# Patient Record
Sex: Female | Born: 1985 | Race: Black or African American | Hispanic: No | Marital: Married | Smoking: Never smoker
Health system: Southern US, Community
[De-identification: ages and names within clinical notes are randomized; demographics above are authoritative.]

## PROBLEM LIST (undated history)

## (undated) DIAGNOSIS — E079 Disorder of thyroid, unspecified: Secondary | ICD-10-CM

## (undated) DIAGNOSIS — F419 Anxiety disorder, unspecified: Secondary | ICD-10-CM

## (undated) DIAGNOSIS — I10 Essential (primary) hypertension: Secondary | ICD-10-CM

## (undated) HISTORY — DX: Anxiety disorder, unspecified: F41.9

## (undated) HISTORY — PX: THYROID CYST EXCISION: SHX2511

---

## 2013-06-12 DIAGNOSIS — F411 Generalized anxiety disorder: Secondary | ICD-10-CM | POA: Insufficient documentation

## 2013-06-23 ENCOUNTER — Emergency Department (HOSPITAL_COMMUNITY): Payer: PRIVATE HEALTH INSURANCE

## 2013-06-23 ENCOUNTER — Encounter (HOSPITAL_COMMUNITY): Payer: Self-pay | Admitting: Emergency Medicine

## 2013-06-23 DIAGNOSIS — Z79899 Other long term (current) drug therapy: Secondary | ICD-10-CM | POA: Insufficient documentation

## 2013-06-23 DIAGNOSIS — Z880 Allergy status to penicillin: Secondary | ICD-10-CM | POA: Insufficient documentation

## 2013-06-23 DIAGNOSIS — F411 Generalized anxiety disorder: Secondary | ICD-10-CM | POA: Insufficient documentation

## 2013-06-23 DIAGNOSIS — R079 Chest pain, unspecified: Secondary | ICD-10-CM | POA: Insufficient documentation

## 2013-06-23 LAB — CBC
HCT: 40.7 % (ref 36.0–46.0)
Hemoglobin: 13.8 g/dL (ref 12.0–15.0)
MCH: 26.4 pg (ref 26.0–34.0)
MCHC: 33.9 g/dL (ref 30.0–36.0)
MCV: 77.8 fL — ABNORMAL LOW (ref 78.0–100.0)
Platelets: 290 K/uL (ref 150–400)
RBC: 5.23 MIL/uL — ABNORMAL HIGH (ref 3.87–5.11)
RDW: 13.9 % (ref 11.5–15.5)
WBC: 7.4 K/uL (ref 4.0–10.5)

## 2013-06-23 LAB — BASIC METABOLIC PANEL
BUN: 12 mg/dL (ref 6–23)
CO2: 24 mEq/L (ref 19–32)
Calcium: 10.9 mg/dL — ABNORMAL HIGH (ref 8.4–10.5)
Chloride: 98 mEq/L (ref 96–112)
Creatinine, Ser: 0.66 mg/dL (ref 0.50–1.10)
GFR calc Af Amer: >90 mL/min (ref >90)
GFR calc non Af Amer: >90 mL/min (ref >90)
GLUCOSE: 91 mg/dL (ref 70–99)
POTASSIUM: 3.7 meq/L (ref 3.7–5.3)
SODIUM: 139 meq/L (ref 137–147)

## 2013-06-23 LAB — I-STAT TROPONIN, ED: Troponin i, poc: 0 ng/mL (ref 0.00–0.08)

## 2013-06-23 NOTE — ED Notes (Addendum)
Pt reports 5/10 central CP with radiation to right arm x 1 week, intermittent. Denies SOB, diaphoresis, N/V.Pt reports increased stress, tearful in triage. [facility], states "they told me it was stress." NAD.

## 2013-06-24 ENCOUNTER — Emergency Department (HOSPITAL_COMMUNITY)
Admission: EM | Admit: 2013-06-24 | Discharge: 2013-06-24 | Disposition: A | Discharge status: Home / Self Care | Payer: PRIVATE HEALTH INSURANCE | Attending: Emergency Medicine | Admitting: Emergency Medicine

## 2013-06-24 DIAGNOSIS — R079 Chest pain, unspecified: Secondary | ICD-10-CM

## 2013-06-24 DIAGNOSIS — F419 Anxiety disorder, unspecified: Secondary | ICD-10-CM

## 2013-06-24 MED ORDER — ALPRAZOLAM 0.5 MG PO TABS: 0.5000 mg | ORAL_TABLET | Freq: Two times a day (BID) | ORAL | Status: AC | PRN

## 2013-06-24 MED ORDER — ALPRAZOLAM 0.5 MG PO TABS: 0.5000 mg | ORAL_TABLET | Freq: Every evening | ORAL | Status: DC | PRN

## 2013-06-24 NOTE — Discharge Instructions (Signed)
°Emergency Department Resource Guide °1) Find a Doctor and Pay Out of Pocket °Although you won't have to find out who is covered by your insurance plan, it is a good idea to ask around and get recommendations. You will then need to call the office and see if the doctor you have chosen will accept you as a new patient and what types of options they offer for patients who are self-pay. Some doctors offer discounts or will set up payment plans for their patients who do not have insurance, but you will need to ask so you aren't surprised when you get to your appointment. ° °2) Contact Your Local Health Department °Not all health departments have doctors that can see patients for sick visits, but many do, so it is worth a call to see if yours does. If you don't know where your local health department is, you can check in your phone book. The CDC also has a tool to help you locate your state's health department, and many state websites also have listings of all of their local health departments. ° °3) Find a Walk-in Clinic °If your illness is not likely to be very severe or complicated, you may want to try a walk in clinic. These are popping up all over the country in pharmacies, drugstores, and shopping centers. They're usually staffed by nurse practitioners or physician assistants that have been trained to treat common illnesses and complaints. They're usually fairly quick and inexpensive. However, if you have serious medical issues or chronic medical problems, these are probably not your best option. ° °No Primary Care Doctor: °- Call [facility] at  832-8000 - they can help you locate a primary care doctor that  accepts your insurance, provides certain services, etc. °- Physician Referral Service- 1-800-533-3463 ° °Chronic Pain Problems: °Organization         Address  Phone   Notes  °[facility]  (336) 297-2271 Patients need to be referred by their primary care doctor.  ° °Medication  Assistance: °Organization         Address  Phone   Notes  °[place] [facility] [address] °[place], [place] [address] (336) 641-8030 --Must be a resident of [place] °-- Must have NO insurance coverage whatsoever (no Medicaid/ Medicare, etc.) °-- The pt. MUST have a primary care doctor that directs their care regularly and follows them in the community °  °[facility]  (866) 331-1348   °[employer]  (888) 892-1162   ° °Agencies that provide inexpensive medical care: °Organization         Address  Phone   Notes  °[facility]  (336) 832-8035   °[facility]    (336) 832-7272   °[facility] [address] °[place], [place] [address] (336) 832-4777   °[facility] [address], °[place] (336) 271-4999   °[facility]    (336) 373-0678   °[facility]    (336) 272-1050   °[facility] ° [address], [place] Phone:  (336) 832-4444, Fax:  (336) 832-4440 Hours of Operation:  9 am - 6 pm, M-F.  Also accepts Medicaid/Medicare and self-pay.  °[facility] ° [address],  Phone: (336) 832-3150, Fax: (336) 832-3151. Hours of Operation:  8:30 am - 5:30 pm, M-F.  Also accepts Medicaid and self-pay.  °[facility] [place] [address], [place] Phone: (336) 878-6027   °[facility] [address], [place], [place] (336)723-1848, Ext. 123 Mondays & Thursdays: 7-9 AM.  First 15 patients are seen on a first come, first serve basis. °  ° °Medicaid-accepting [place] Providers: ° °Organization         Address  Phone   Notes  °[facility] [address], [place] (336) 641-2100 Also accepts self-pay patients.  °[facility] [address], [place] ° (336) 856-9996   °[facility] [address], [place]  (336) 288-8857   °[facility] [address] [place] [address], [place] (336) 299-7000   °Veita Bland [address], [place]  ° (336) 373-1557 Only accepts [place] Access Medicaid patients after they have their name applied to their card.  ° °Self-Pay (no insurance) in [place]: ° °Organization         Address  Phone   Notes  °Sickle Cell Patients, [facility] [address], [place] (336) 832-1970   °[facility] [address], [place] (336) 832-4400   ° [facility] ° [address], [place] (336) 992-4800   °[facility]/Dr. Osei-Bonsu ° [address] [place] [address], [place] or [address], [place] (336) 841-8500 Phone number for both [place] and [place] locations is the same.  °[facility] [address], [place] (336) 299-0000   °[facility] [address] [place] [address], [place] or [address] (336) 852-7530 °(336) 878-2260   °[facility] [address], [place] (336) 350-1642, phone; (336) 294-5005, fax Sees patients 1st and 3rd Saturday of every month.  Must not qualify for public or private insurance (i.e. Medicaid, Medicare, [place] Health Choice, Veterans' Benefits) • Household income should be no more than 200% of the poverty level •The clinic cannot treat you if you are pregnant or think you are pregnant • Sexually transmitted diseases are not treated at the clinic.  ° ° °Dental Care: °Organization         Address  Phone  Notes  °[place] [facility] [address], [place] (336) 641-6152 Accepts children up to age 21 who are enrolled in Medicaid or [place] Health Choice; pregnant women with a Medicaid card; and children who have applied for Medicaid or [place] Health Choice, but were declined, whose parents can pay a reduced fee at time of service.  °[place]  [facility] [place]  [address], [place] (336) 641-7733 Accepts children up to age 21 who are enrolled in Medicaid or [place] Health Choice; pregnant women with a Medicaid card; and children who have applied for Medicaid or [place] Health Choice, but were declined, whose parents can pay a reduced fee at time of service.  °[facility] ° [address],  (336) 641-4533 Patients are seen by appointment only. Walk-ins are not accepted. [facility] will see patients 18 years of age and older. °Monday - Tuesday (8am-5pm) °Most Wednesdays (8:30-5pm) °$30 per visit, cash only  °[facility] ° [address], [place] (336) 641-4533 Patients are seen by appointment only. Walk-ins are not accepted. [facility] will see patients 18 years of age and older. °One   Wednesday Evening (Monthly: Volunteer Based).  $30 per visit, cash only  °[facility]  (919) 537-3737 for adults; Children under age 4, call Graduate Pediatric Dentistry at (919) [phone]. Children aged 4-14, please call (919) [phone] to request a pediatric application. ° Dental services are provided in all areas of dental care including fillings, crowns and bridges, complete and partial dentures, implants, gum treatment, root canals, and extractions. Preventive care is also provided. Treatment is provided to both adults and children. °Patients are selected via a lottery and there is often a waiting list. °  °[facility] [address], °[place] ° (336) 763-8833 [email or web address].[email or web address] °  °[facility] [address], [place], [place] (336)723-1848, Ext. 123 Second and Fourth Thursday of each month, opens at 6:30 AM; Clinic ends at 9 AM.  Patients are seen on a first-come first-served basis, and a limited number are seen during each clinic.  ° °[facility] ° [address], [place], [place] (336) 723-7904    Eligibility Requirements °You must have lived in [place], or [place] counties for at least the last three months. °  You cannot be eligible for state or federal sponsored healthcare insurance, including Veterans Administration, Medicaid, or Medicare. °  You generally cannot be eligible for healthcare insurance through your employer.  °  How to apply: °Eligibility screenings are held every Tuesday and Wednesday afternoon from 1:00 pm until 4:00 pm. You do not need an appointment for the interview!  °[facility] [address], [place], [place] 336-631-2330   °[facility]  336-342-8273   °[facility]  336-703-3100   °[facility]  336-570-6415   ° °Behavioral Health Resources in the Community: °Intensive Outpatient Programs °Organization         Address  Phone  Notes  °[facility] [address], [place], [place] 336-878-6098   °[facility] [address], [place], [place] 336-832-9800   °[facility] [address], [place], [place] ° 336-882-2125   °[facility] [address],  °[place], [place] 1-800-853-5163 or 336-641-4981   °Substance Abuse Resources °Organization         Address  Phone  Notes  °[facility]  336-882-2125   °[facility]  336-784-9470   °[facility]  336-285-9073   °[facility]  336-845-3988   °Residential & Outpatient Substance Abuse Program  1-800-659-3381   °Psychological Services °Organization         Address  Phone  Notes  °[facility]  336- 832-9600   °[facility]  336- 378-7881   °[facility] [address], [place] 1-800-853-5163 or 336-641-4981   ° °Mobile Crisis Teams °Organization         Address  Phone  Notes  °[facility], Mobile Crisis Care Unit  1-877-626-1772   °Assertive °[facility] ° [address]  [place], [place] 336-834-9664   °Sharon DeEsch [address] °[place] 336-554-5454   ° °Self-Help/Support Groups °Organization         Address  Phone             Notes  °[facility] - variety of support groups  336- 373-1402 Call for more information  °Narcotics Anonymous (NA), [facility] [address], °[place]  2 meetings at this location  ° °  Residential Treatment Programs °Organization         Address  Phone  Notes  °[facility] [address],    °[place]  1-866-801-8205   °[facility] ° [address], [place], [place] 704-293-8524   °[facility] [address], [place] 336-845-3988 Admissions: 8am-3pm M-F  °[facility] [address]    °[place], [place] 336-841-1104   °[facility] [address], [place], [place] 336-379-7146   °[facility] [address]  °[place], [place] 336-285-9073   °[facility] - Intensive Outpatient [address], [place], [place] 336-852-3033   °[facility] [address]  °[place], [place] 1-877-615-2722 or 336-784-9470   °[facility] [address] [place], [place] 336-227-7417 Accepts Medicaid  °[facility] [address]  ° [place] 1-800-659-3381 Substance Abuse/Addiction Treatment  ° °[place] Behavioral Health Resources °Organization         Address  Phone  Notes  °[facility]  (888) 581-9988   °Julie Brannon, PhD [address] [place], [place]   (336) 349-5553 or (336) 951-0000   °[facility]   [address] °[place], [place] (336) 349-4454   °[facility] [address], [place], [place] (336) 342-8316 Insurance/Medicaid/sponsorship through [facility]  °[facility] [address]                                    [place], [place] (336) 342-8316 Therapy/tele-psych/case    °[facility] [address]  ° [place], [place] (336) 349-2233    °Dr. Arfeen  (336) 349-4544   °[facility] [place]  [employer] [place] [facility] 1) [address], [place] °2) [address], [place] °3)  [address], [place] (336) 349-3220 °(336) 342-7768 ° °(336) 342-8140   °[place] [facility] (336) 342-1394 or (336) 342-3537 (After Hours)    ° ° °

## 2013-06-24 NOTE — ED Provider Notes (Signed)
CSN: [id number]     Arrival date & time 06/23/13  2024 History   First MD Initiated Contact with Patient 06/24/13 0035     Chief Complaint  Patient presents with  . Chest Pain     (Consider location/radiation/quality/duration/timing/severity/associated sxs/prior Treatment) HPI Comments: 28 year old otherwise healthy female with no risk factors for pulmonary embolism and no risk factors for acute coronary syndrome presents with approximately 24 hours of intermittent chest pain. She describes this as a tightness, the pain is sometimes located on the left, sometimes on the right, sometimes in the shoulders and seems to be associated with increased stress and anxiety. She denies coughing fever swelling of the legs or any other complaints. She has had no medications for this, this is nonexertional in nature.  Patient is a 28 y.o. female presenting with chest pain. The history is provided by the patient.  Chest Pain   History reviewed. No pertinent past medical history. History reviewed. No pertinent past surgical history. Family History  Problem Relation Age of Onset  . Hypertension Mother   . Stroke Mother   . Heart failure Father   . Diabetes Sister    History  Substance Use Topics  . Smoking status: Never Smoker   . Smokeless tobacco: Not on file  . Alcohol Use: Yes   OB History   Grav Para Term Preterm Abortions TAB SAB Ect Mult Living                 Review of Systems  Cardiovascular: Positive for chest pain.  All other systems reviewed and are negative.     Allergies  Penicillins  Home Medications   Prior to Admission medications   Medication Sig Start Date End Date Taking? Authorizing Provider  BIOTIN PO Take 1 tablet by mouth daily.   Yes Historical Provider, MD  calcium carbonate (TUMS - DOSED IN MG ELEMENTAL CALCIUM) 500 MG chewable tablet Chew 1 tablet by mouth 2 (two) times daily as needed for indigestion or heartburn.   Yes Historical Provider, MD   ibuprofen (ADVIL,MOTRIN) 200 MG tablet Take 200 mg by mouth every 6 (six) hours as needed for mild pain.   Yes Historical Provider, MD  ALPRAZolam Prudy Feeler(XANAX) 0.5 MG tablet Take 1 tablet (0.5 mg total) by mouth 2 (two) times daily as needed for anxiety. 06/24/13   Vida RollerBrian D Rhonin Trott, MD   BP 125/74  Pulse 98  Temp(Src) 98.3 F (36.8 C) (Oral)  Resp 18  SpO2 98%  LMP 05/31/2013 Physical Exam  Nursing note and vitals reviewed. Constitutional: She appears well-developed and well-nourished. No distress.  HENT:  Head: Normocephalic and atraumatic.  Mouth/Throat: Oropharynx is clear and moist. No oropharyngeal exudate.  Eyes: Conjunctivae and EOM are normal. Pupils are equal, round, and reactive to light. Right eye exhibits no discharge. Left eye exhibits no discharge. No scleral icterus.  Neck: Normal range of motion. Neck supple. No JVD present. No thyromegaly present.  Cardiovascular: Normal rate, regular rhythm, normal heart sounds and intact distal pulses.  Exam reveals no gallop and no friction rub.   No murmur heard. Pulmonary/Chest: Effort normal and breath sounds normal. No respiratory distress. She has no wheezes. She has no rales.  Abdominal: Soft. Bowel sounds are normal. She exhibits no distension and no mass. There is no tenderness.  Musculoskeletal: Normal range of motion. She exhibits no edema and no tenderness.  Lymphadenopathy:    She has no cervical adenopathy.  Neurological: She is alert. Coordination normal.  Appears slightly  anxious  Skin: Skin is warm and dry. No rash noted. No erythema.  Psychiatric: She has a normal mood and affect. Her behavior is normal.    ED Course  Procedures (including critical care time) Labs Review Labs Reviewed  CBC - Abnormal; Notable for the following:    RBC 5.23 (*)    MCV 77.8 (*)    All other components within normal limits  BASIC METABOLIC PANEL - Abnormal; Notable for the following:    Calcium 10.9 (*)    All other components  within normal limits  Rosezena SensorI-STAT TROPOININ, ED    Imaging Review Dg Chest 2 View  06/23/2013   CLINICAL DATA:  Chest tightness for 6 days. Finding healing of the right arm with numbness and tingling in the hand.  EXAM: CHEST  2 VIEW  COMPARISON:  None.  FINDINGS: The heart size and mediastinal contours are within normal limits. Both lungs are clear. The visualized skeletal structures are unremarkable.  IMPRESSION: No active cardiopulmonary disease.   Electronically Signed   By: Rosalie GumsBeth  Brown M.D.   On: 06/23/2013 21:37     EKG Interpretation   Date/Time:  Tuesday June 23 2013 20:33:47 EDT Ventricular Rate:  89 PR Interval:  138 QRS Duration: 90 QT Interval:  334 QTC Calculation: 406 R Axis:   70 Text Interpretation:  Normal sinus rhythm with sinus arrhythmia  Nonspecific T wave abnormality Abnormal ECG No old tracing to compare  Confirmed by Kinaya Hilliker  MD, Zea Kostka ([id number]) on 06/24/2013 12:34:43 AM      MDM   Final diagnoses:  Chest pain  Anxiety    Labs normal, EKG unremarkable, chest x-ray normal, patient has ongoing mild symptoms are likely related to anxiety. I discussed with her the risks benefits and alternatives to using medication, she accepts a prescription for 5 tablets of Xanax to use as needed and will followup with her family doctor, resources given.  Doubt pulmonary embolism, doubt acute coronary syndrome.  Meds given in ED:  Medications - No data to display  New Prescriptions   ALPRAZOLAM (XANAX) 0.5 MG TABLET    Take 1 tablet (0.5 mg total) by mouth 2 (two) times daily as needed for anxiety.        Vida RollerBrian D Gurnoor Sloop, MD 06/24/13 [id number]

## 2013-06-24 NOTE — ED Notes (Signed)
Pt c/o intermittent rt sided cp, pt states sometimes the pain radiates to rt side of neck and shoulder. Pt describes pain as a tension or tightness. Pt states ibuprofen helped pain a little bit. Pt denies injury. Pt rates pain 3/10. Denies any n/v, sob, or diaphoresis. NAD

## 2014-10-11 IMAGING — CR DG CHEST 2V
2 series · 2 of 2 positions shown · non-contrast
Comparison: None.

CLINICAL DATA: Chest tightness for 6 days. Finding healing of the
right arm with numbness and tingling in the hand.

EXAM:
CHEST  2 VIEW

[w chest pa]
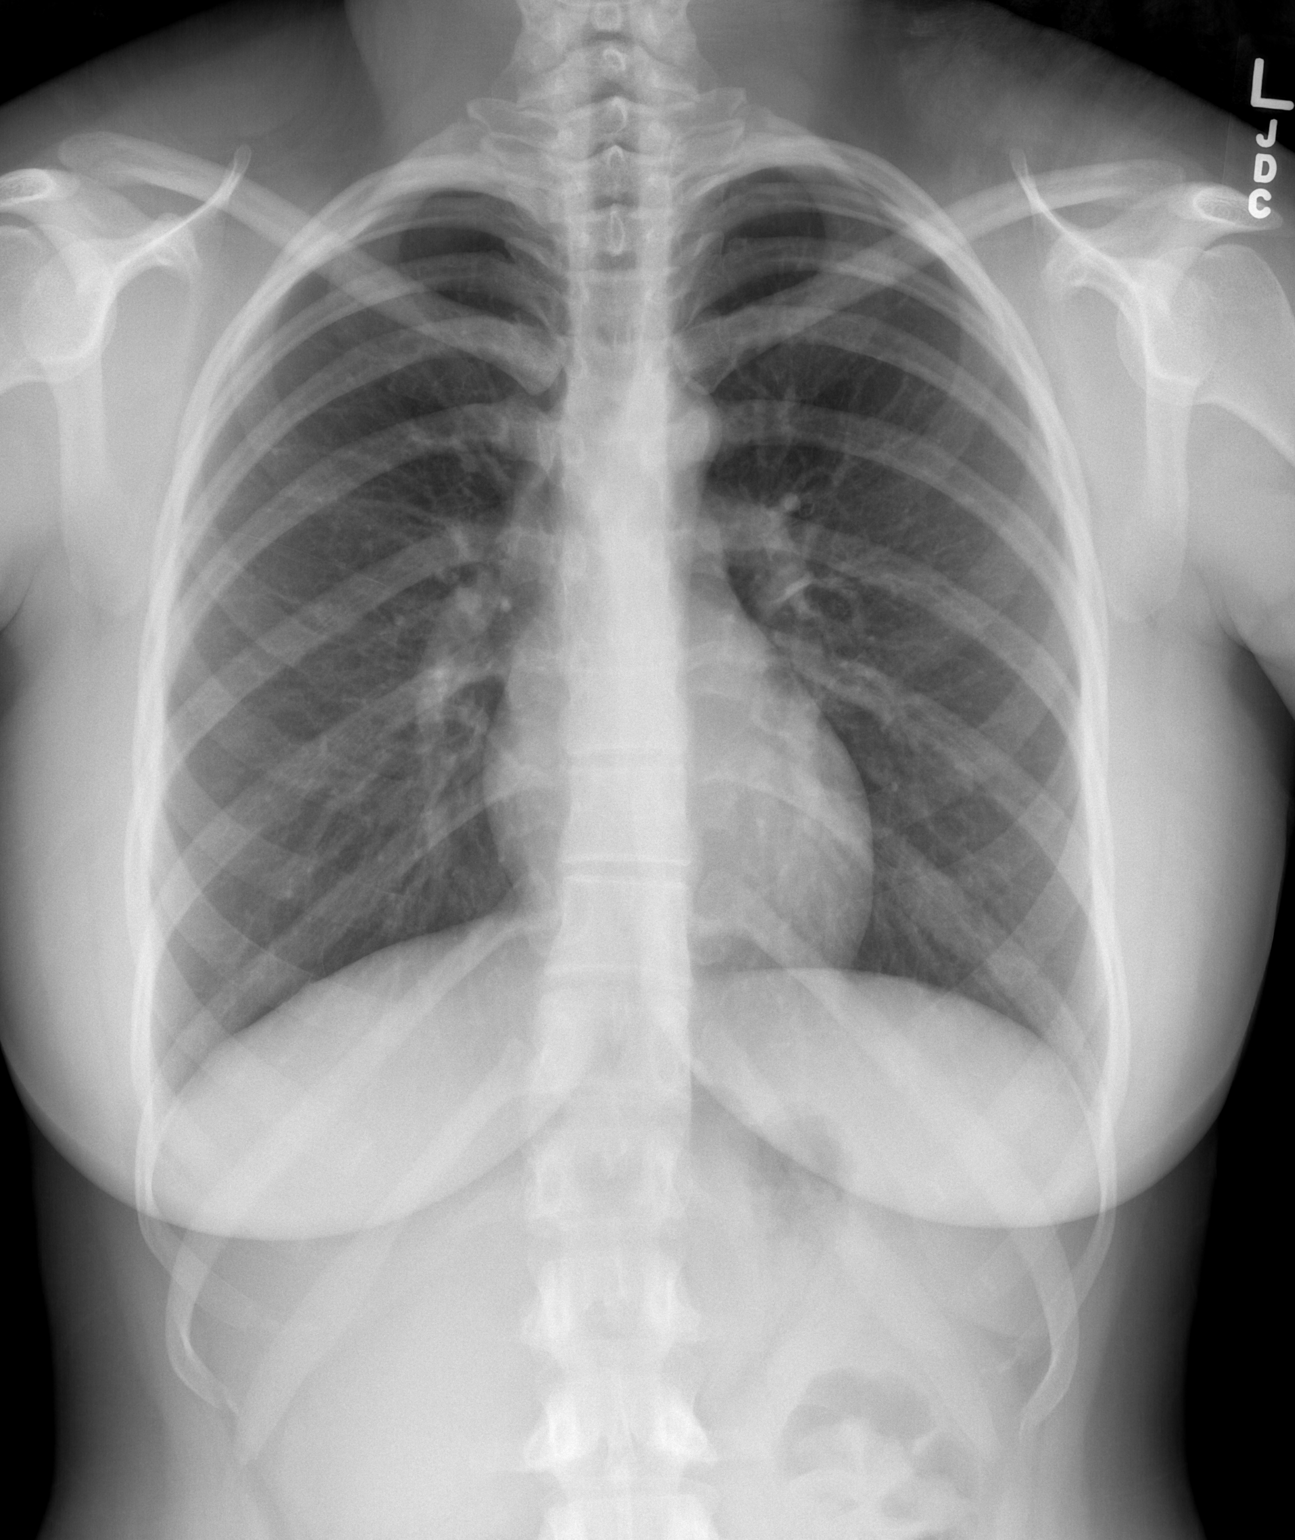

[w chest lat]
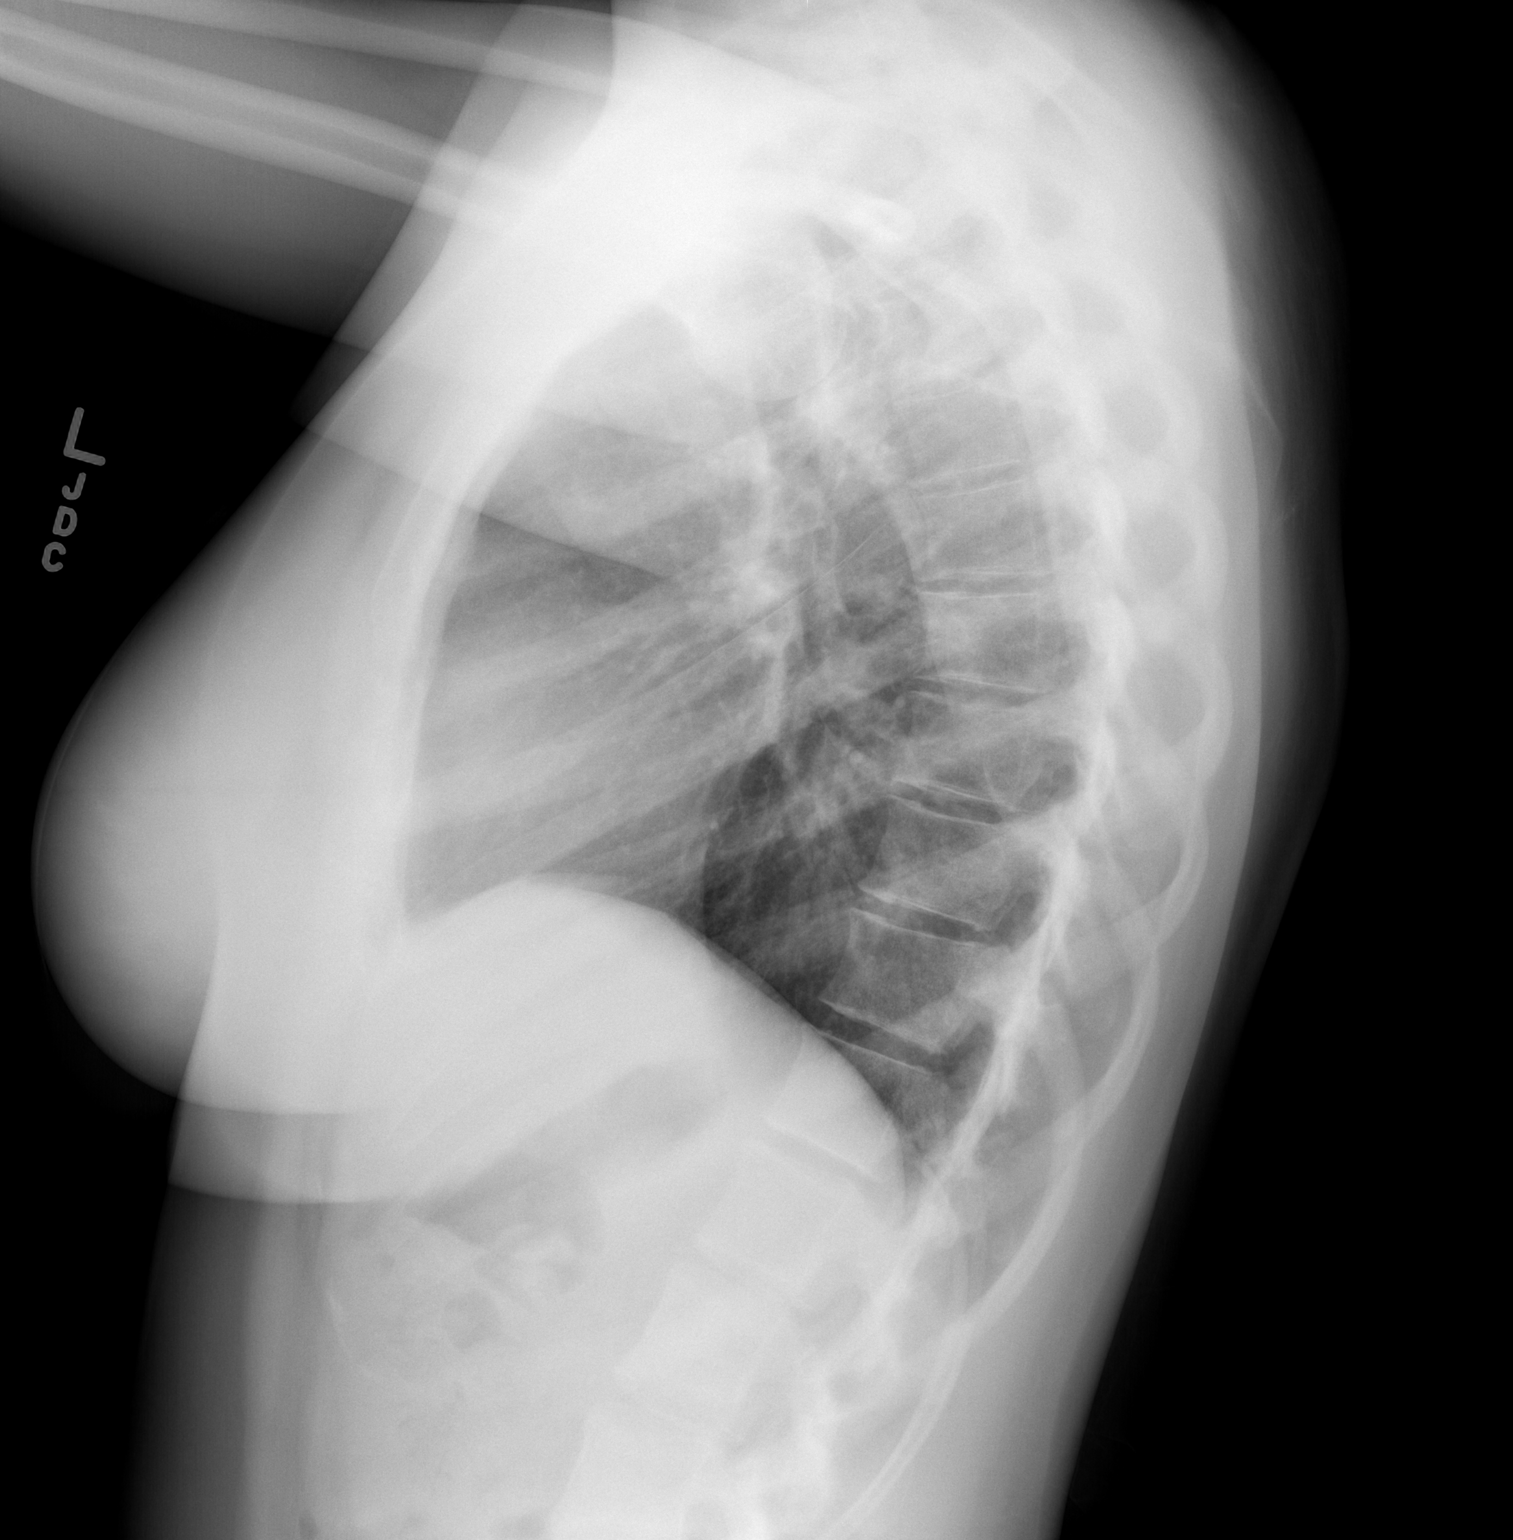

[2 of 2 positions shown; findings below may reference images not displayed]

FINDINGS: The heart size and mediastinal contours are within normal limits.
Both lungs are clear. The visualized skeletal structures are
unremarkable.
IMPRESSION: No active cardiopulmonary disease.

## 2015-09-10 HISTORY — PX: WISDOM TOOTH EXTRACTION: SHX21

## 2020-10-12 ENCOUNTER — Encounter (HOSPITAL_BASED_OUTPATIENT_CLINIC_OR_DEPARTMENT_OTHER): Payer: Self-pay | Admitting: Obstetrics & Gynecology

## 2020-10-13 ENCOUNTER — Ambulatory Visit (FREE_STANDING_LABORATORY_FACILITY): Admitting: Obstetrics and Gynecology

## 2020-10-13 ENCOUNTER — Encounter (HOSPITAL_BASED_OUTPATIENT_CLINIC_OR_DEPARTMENT_OTHER): Payer: Self-pay | Admitting: Obstetrics and Gynecology

## 2020-10-13 VITALS — BP 132/80 | HR 96 | Temp 97.0°F | Ht 68.0 in | Wt 189.0 lb

## 2020-10-13 DIAGNOSIS — O09521 Supervision of elderly multigravida, first trimester: Secondary | ICD-10-CM

## 2020-10-13 DIAGNOSIS — Z3200 Encounter for pregnancy test, result unknown: Secondary | ICD-10-CM

## 2020-10-13 DIAGNOSIS — N912 Amenorrhea, unspecified: Secondary | ICD-10-CM

## 2020-10-13 NOTE — Patient Instructions (Signed)
Information on Genetic Screening   There are many different types of genetic screening tests available during your pregnancy. You may opt for some or none of these tests. This information is a basic guide to help sort through the many options and should be used as a starting point for discussion with your physician.     First Trimester Nuchal Translucency Testing:  Detection rate for Down Syndrome - 82-87%  This test is done between 11 weeks 6 days - 13 weeks 6 days and is also called the first trimester screening test. This test combines a targeted ultrasound and blood tests that help identify risk of a pregnancy with a chromosomal abnormality.   The blood test identifies the amount of two hormones:  human chorionic gonadotropin (hCG) and pregnancy associated plasma protein-A (PAPP-A)  Carrier Testing for Common Genetic Mutations  Several labs offer testing to see if you carry a common genetic mutation such as cystic fibrosis. Your insurance may or may not cover this type of testing.   Additional testing is offered for patients with Ashkenazi Jewish background  Cell Free Fetal DNA or Non-Invasive Prenatal Testing: Detection rate for Down Syndrome - 99%   This blood test is preformed after [redacted] weeks gestation and is typically offered to women over the age of 35 depending on insurance coverage. Insurance coverage for this type of testing varies.  This test also provides information about trisomy 13, trisomy 18 and sex chromosome abnormalities such as Turner Syndrome.  MSAFP: Detection rate for Neural Tube Defects (Spina Bifida) - 85%  This blood test is done between 16-[redacted] weeks gestation and screens for risk of the spinal cord not closing properly resulting in spinal cord defect such as spina bifida.   Quadruple Screen: Detection rate for Down Syndrome - 81%  This test is done between 15 weeks and [redacted] weeks gestation. The best time to perform this test is between 16-18 weeks.   This test measures four blood markers:  HGC, Alpha fetoprotein  Anatomy Ultrasound: Detection rate for Down Syndrome 50-60%             Detection rate for Neural Tube Defects:  97%   This test uses ultrasound to identify structural abnormalities and screens for markers that may indicate a genetic problem.   Diagnostic Testing  If a screening test is abnormal your physician will offer a diagnostic test which gives a definitive answer about an abnormality. The two diagnostic tests are chorionic villi sampling and amniocentesis.   Chorionic Villi Sampling: This diagnostic test obtains a small sample of the placenta to assess fetal DNA  Amniocentesis:  This diagnostic test obtains a sample of amniotic fluid for analysis including fetal DNA

## 2020-10-13 NOTE — Progress Notes (Signed)
HPI:  Heidi Espinoza is a 35 y.o. female No obstetric history on file. here for CONFIRMATION OF PREGNANCY. Patient's last menstrual period was 07/28/2020 (exact date).. EGA = 11w d by EDD  05/04/2021  Patient had positive home UPT. Patient reports: nausea. Patient has not experienced vaginal bleeding or cramping. She is taking PNV. She is accompanied by: Her partner " DRAY"    Gyn Hx:  Patient's last menstrual period was 07/28/2020 (exact date).  Last PAP: 2021 normal / Hx abnormal pap: Yes 2018 and 2020 colposcopies benign per patient   Menses: regular  BCM previous Nuvaring  Sexual Hx: active  Hx STDs: Yes Chlamydia remote   OB Hx: No obstetric history on file.    OB History   No obstetric history on file.     History reviewed. No pertinent past medical history.  History reviewed. No pertinent surgical history.  Social History     Socioeconomic History    Marital status: Married   Tobacco Use    Smoking status: Never    Smokeless tobacco: Never   Substance and Sexual Activity    Alcohol use: Not Currently    Drug use: Never    Sexual activity: Yes     Partners: Male     Family History   Problem Relation Age of Onset    Stroke Mother     Hyperlipidemia Mother     Hypertension Mother     Bell's palsy Mother     Hyperlipidemia Father     Diabetes Father      Current Outpatient Medications on File Prior to Visit   Medication Sig Dispense Refill    Prenatal MV & Min w/FA-DHA (Prenatal Adult Gummy/DHA/FA) 0.4-25 MG Chew Tab Chew by mouth       No current facility-administered medications on file prior to visit.     Allergies   Allergen Reactions    Penicillins Hives, Other (See Comments) and Rash     Pt unsure; reaction as child          Review of Systems:  General ROS: negative for fatigue, fever/chills, weight loss  Breast ROS: negative for breast lumps, nipple discharge  Gastrointestinal ROS: no abdominal pain, change in bowel habits  Genitourinary ROS: no dysuria, trouble voiding, or hematuria  Skin: denies rash or  lesion  Psych: denies depression  All other systems as in HPI otherwise reviewed and negative     Objective: BP 132/80   Pulse 96   Temp 97 F (36.1 C)   Ht 5\' 8"  (1.727 m)   Wt 189 lb (85.7 kg)   LMP 07/28/2020 (Exact Date)   BMI 28.74 kg/m   PE:  General appearance: alert & oriented x3, well appearing, NAD  Neck/thyroid: supple, no LAD, nodularity or masses  Breasts: symmetric, no masses/lumps, no nipple discharge, no skin changes, mildly tender, no axillary LAD.   Abdomen: soft, nontender, nondistended, no masses  Extremities: symmetric, no signs of clubbing or edema   Pelvic exam:  VULVA: normal appearing vulva with no masses, tenderness or lesions, normal clitoris,  VAGINA: normal appearing vagina with normal color/secretions, no abnormal discharge, no lesions,  CERVIX: normal appearing cervix without discharge or lesions,  UTERUS: uterus is gravid, size = dates, non-tender, anteverted  ADNEXA:  Non-tender and no masses.  RECTAL: normal external, no hemorrhoids    Limited 1st TM Transvaginal OB Dating Sonogram: BEST EDD = 05/04/21  Live IUP: single, +gestational sac, +yolk sac  CRL = 3.9 cm  Fetal heart activity: yes  EGA by LMP: 11W  EGA by [place]: [redacted]W[redacted]D  Best EDD determined by: LMP~CRL  No adnexal masses seen, No free fluid noted  Pictures taken, scanned to Epic and provided to pt.    Assessment:    35 y.o. No obstetric history on file. IUP @ 11 weeks presents for COP visit.    Pregnancy-related issues/complications:  - AMA by time of delivery       Plan:  1. COP completed with sonogram and BEST dating determined.  2. Prenatal panel ordered, cultures obtained: urine/cervical and PAP UTD, patient will get records     3. Reviewed maternal carrier and fetal genetic screening options:  -  Maternal carrier screening: ACOG recommends Hemoglobinopathy panel, Cystic Fibrosis, SMA, Fragile X at minimum; Myriad packet provided, expanded panel also available.  -  Reviewed NIPT options: [facility] cfDNA offered vs FTS  with NT sono. [facility]riad packet provided and referral given to MFM [facility] if desired.   - Patient decided to proceed with: cfDNA    4. Recommend: PNV w/ DHA daily, healthy habits/diet, FLU shot during season, TDAP 3rd TM, Covid vaccine.  5. NEW OB packet provided to pt (reviewed contents):   - briefly discussed 1st TM pregnancy expectations  - SAB precautions,   - Routine PNC/schedule and compliance;  practice philosophy  - Delivery [facility]  6. Questions addressed, plan of care reviewed   7. Follow-up: 4 weeks for Initial [facility] visit.     Orders Placed This Encounter   Procedures    Urine culture    Genital Chlamydia/Neisseria BY PCR    Platelet count    Rubella antibody, IgG    Hepatitis B (HBV) Surface Antigen w/ Reflex to Confirmation    Hepatitis C (HCV) antibody, Total    HIV-1/2 Ag/Ab 4th Gen. w/ Reflex    TSH    Varicella Zoster Antibody, IgG    Syphilis Screen IgG and IgM    Prenatal  Workup    MFM NTL         This 35 minutesvisit of this new patient spent in counseling of patient as above re: new pregnancy diagnosis; discussion of recommended testing/options, expectations of care and answering questions.    COVID-19 Pandemic: Discussed measures being taken to prevent community spread as they relate to OB care (antepartum, intrapartum and postpartum). Patient aware visit schedule may change and telemedicine visits will be implemented whenever appropriate. Also discussed changes to visitation policy. Patient encouraged to access [place]'s COVID-19 website often for updated information.     Completed by:  Emilee Hero, MD, MD [facility]

## 2020-10-14 ENCOUNTER — Encounter (HOSPITAL_BASED_OUTPATIENT_CLINIC_OR_DEPARTMENT_OTHER): Payer: Self-pay | Admitting: Obstetrics and Gynecology

## 2020-10-14 ENCOUNTER — Encounter (INDEPENDENT_AMBULATORY_CARE_PROVIDER_SITE_OTHER): Payer: Self-pay | Admitting: Obstetrics and Gynecology

## 2020-10-14 LAB — GENITAL CHLAMYDIA/NEISSERIA BY PCR
Chlamydia DNA by PCR: NEGATIVE
Neisseria gonorrhoeae by PCR: NEGATIVE

## 2020-10-18 ENCOUNTER — Encounter (HOSPITAL_BASED_OUTPATIENT_CLINIC_OR_DEPARTMENT_OTHER): Payer: Self-pay | Admitting: Obstetrics and Gynecology

## 2020-10-19 ENCOUNTER — Other Ambulatory Visit

## 2020-10-19 LAB — PLATELET COUNT: Platelets: 281 10*3/uL (ref 142–346)

## 2020-10-19 LAB — HEPATITIS B SURFACE ANTIGEN W/ REFLEX TO CONFIRMATION: Hepatitis B Surface Antigen: NONREACTIVE

## 2020-10-19 LAB — HIV-1/2 AG/AB 4TH GEN. W/ REFLEX: HIV Ag/Ab, 4th Generation: NONREACTIVE

## 2020-10-19 LAB — PRENATAL  WORKUP
AB Screen Gel: NEGATIVE
ABO Rh: O POS

## 2020-10-19 LAB — VARICELLA ZOSTER ANTIBODY, IGG: Varicella, IgG: 886.8 Index

## 2020-10-19 LAB — TSH: TSH: 1.41 u[IU]/mL (ref 0.35–4.94)

## 2020-10-19 LAB — RUBELLA ANTIBODY, IGG: Rubella AB, IgG: 2.13 Index

## 2020-10-19 LAB — SYPHILIS SCREEN IGG AND IGM: Syphilis Screen IgG and IgM: NONREACTIVE

## 2020-10-19 LAB — HEPATITIS C ANTIBODY: Hepatitis C, AB: NONREACTIVE

## 2020-10-20 ENCOUNTER — Encounter (HOSPITAL_BASED_OUTPATIENT_CLINIC_OR_DEPARTMENT_OTHER): Payer: Self-pay | Admitting: Obstetrics and Gynecology

## 2020-10-27 ENCOUNTER — Encounter (HOSPITAL_BASED_OUTPATIENT_CLINIC_OR_DEPARTMENT_OTHER): Payer: Self-pay | Admitting: Obstetrics and Gynecology

## 2020-11-01 ENCOUNTER — Encounter (HOSPITAL_BASED_OUTPATIENT_CLINIC_OR_DEPARTMENT_OTHER): Payer: Self-pay | Admitting: Obstetrics and Gynecology

## 2020-11-10 ENCOUNTER — Ambulatory Visit (HOSPITAL_BASED_OUTPATIENT_CLINIC_OR_DEPARTMENT_OTHER): Admitting: Nurse Practitioner

## 2020-11-11 ENCOUNTER — Other Ambulatory Visit

## 2020-11-11 ENCOUNTER — Ambulatory Visit (FREE_STANDING_LABORATORY_FACILITY): Admitting: Obstetrics & Gynecology

## 2020-11-11 ENCOUNTER — Encounter (HOSPITAL_BASED_OUTPATIENT_CLINIC_OR_DEPARTMENT_OTHER): Payer: Self-pay | Admitting: Obstetrics & Gynecology

## 2020-11-11 ENCOUNTER — Encounter (HOSPITAL_BASED_OUTPATIENT_CLINIC_OR_DEPARTMENT_OTHER): Payer: Self-pay

## 2020-11-11 VITALS — BP 124/79 | HR 94 | Temp 98.0°F | Wt 191.4 lb

## 2020-11-11 DIAGNOSIS — Z3A15 15 weeks gestation of pregnancy: Secondary | ICD-10-CM

## 2020-11-11 DIAGNOSIS — O0992 Supervision of high risk pregnancy, unspecified, second trimester: Secondary | ICD-10-CM

## 2020-11-11 DIAGNOSIS — O09522 Supervision of elderly multigravida, second trimester: Secondary | ICD-10-CM

## 2020-11-11 NOTE — Progress Notes (Addendum)
HPI: Heidi Espinoza is a 35 y.o. female G2P0010 at [redacted]w[redacted]d, by LMP c/w 11 week ultrasound (BEST Estimated Date of Delivery: 05/04/21) . She now presents for INITIAL PREGNANCY VISIT. Denies vaginal bleeding, vaginal discharge, severe pelvic/abd pain, possible RPL, nausea/vomiting. She is doing well today. She had prenatal panel and cultures done at her last OV. She is accompanied by: spouse. Questions re: practice     Genetic screening: NIPT wnl (gender in report), MCS pending  Anatomy US: will schedule  Vaccines: +Covid (M x3), needs TDAP/Flu  Work: Clinical biochemist    Pregnancy Complications/Issues:  1. AMA - MFM consult    Gyn Hx:  Patient's last menstrual period was 07/28/2020 (exact date).  Last PAP: 2021 normal  Hx abnormal pap: Yes 2018 and 2020 colposcopies benign per patient   Menses: regular  BCM previous: Nuvaring  Sexual Hx: active  Hx STDs: Yes Chlamydia remote   OB Hx: G2P0010 (EAB x1, no D&C)    OB History   Gravida Para Term Preterm AB Living   2 0 0 0 1 0   SAB IAB Ectopic Multiple Live Births     1 0 0 0      # Outcome Date GA Lbr Len/2nd Weight Sex Delivery Anes PTL Lv   2 Current            1 IAB              PMH:   History reviewed. No pertinent past medical history.  PSH:  Past Surgical History:   Procedure Laterality Date    THYROID CYST EXCISION Right     in 3rd grade, path = benign     Allergies   Allergen Reactions    Penicillins Hives, Other (See Comments) and Rash     Pt unsure; reaction as child       Other Environmental      seasonal     No outpatient medications have been marked as taking for the 11/11/20 encounter (Initial Prenatal) with Idelle Leech, MD.     FH:  Family History   Problem Relation Age of Onset    Stroke Mother     Hyperlipidemia Mother     Hypertension Mother     Bell's palsy Mother     Hyperlipidemia Father     Diabetes Father      SH:  Social History     Socioeconomic History    Marital status: Married     Spouse name: None    Number of children: None    Years of  education: None    Highest education level: None   Occupational History    None   Tobacco Use    Smoking status: Never    Smokeless tobacco: Never   Substance and Sexual Activity    Alcohol use: Not Currently    Drug use: Never    Sexual activity: Yes     Partners: Male   Other Topics Concern    None   Social History Narrative    None     Social Determinants of Health     Financial Resource Strain: Not on file   Food Insecurity: Not on file   Transportation Needs: Not on file   Physical Activity: Not on file   Stress: Not on file   Social Connections: Not on file   Intimate Partner Violence: Not on file     Review of Systems:  See HPI    PE:  BP 124/79   Pulse 94   Temp 98 F (36.7 C)   Wt 191 lb 6.4 oz (86.8 kg)   LMP 07/28/2020 (Exact Date)   BMI 29.10 kg/m   General appearance: alert, well appearing, and in no distress  Mental status: alert and oriented x3  Heart: normal HR via pulse ox  Lungs: unlabored breathing, equal chest expansion  Breasts: deferred  Abdomen: gravid, +FHR, soft, non-distended, non-tender  Pelvic exam: deferred, see exam from last OV      Assessment:  35 y.o. G2P0010 at [redacted]w[redacted]d by LMP c/w 11 week sono.      Best EDD: Estimated Date of Delivery: 05/04/21    Plan:    - Prenatal labs reviewed, Cultures reviewed, PAP UTD  - MCS pending, cfDNA (wnl)  - Discussed PNC expectations and general visit schedule, practice philosophy, recommended testing  - Recommend FLU vaccine during Flu season  - AFP and repeat urine cultures sent today, level II anatomy US order provided  - See AVS instructions, multiple questions addressed and 2nd TM expectations reviewed.  - RTO: @ [redacted] weeks EGA  - NEXT OV: review labs/level II [place]    Orders Placed This Encounter   Procedures    Urine culture    Alpha Fetoprotein (AFP) Maternal    MFM LEVEL II / TVS     Berneita Sanagustin J. Mayford Knife, MD, [facility]  [facility] [place]

## 2020-11-11 NOTE — Patient Instructions (Addendum)
Weeks 14-18 of Your Pregnancy   You are now in your second trimester. Many women start to feel better during the second trimester and experience less fatigue, less nausea and vomiting.   Intimacy during Pregnancy  ? For almost all women sex and intimacy are perfectly safe during pregnancy.   ? Some conditions such as having the placenta covering the cervix make sex unsafe these conditions are usually identified on ultrasound  ? During pregnancy the cervix has increased blood supply and often you may experience some spotting after intercourse. Most of the time this is nothing to be alarmed about. However, if you have bleeding that is more than spotting or continues more than the day or two after intercourse please contact your physician.   ? As your body changes during pregnancy you may find different positions more comfortable than others  Things to Consider at this Point in your Pregnancy   ? The MSAFP screening test for spinal cord defects is collected between weeks 16-18.   ? You may start to feel fetal movements usually after 16 weeks, especially if this is not your first baby. Most women experience fetal movement by about 22 weeks. How you experience fetal movement often depends on the location of your baby's placenta. Each pregnancy is different and fetal movement this pregnancy may be different from your prior pregnancies.   ? You should have your detailed anatomy ultrasound scheduled between 18-[redacted] weeks gestation.

## 2020-11-15 LAB — ALPHA FETOPROTEIN, MATERNAL
Gestational Age at Collection by Ultrasound: 151 wk,d
Maternal Alpha-Fetoprotein MoM: 0.76 {M.o.M} (ref <2.50)
Maternal Alpha-Fetoprotein: 19.9 ng/mL
Maternal Calculated Age at EDD: 35
Maternal Serum Screen Result Summary: NORMAL
Number of Fetuses: 1
Weight in Pounds: 191 [lb_av]

## 2020-11-16 ENCOUNTER — Encounter (HOSPITAL_BASED_OUTPATIENT_CLINIC_OR_DEPARTMENT_OTHER): Payer: Self-pay | Admitting: Obstetrics & Gynecology

## 2020-11-20 ENCOUNTER — Encounter (HOSPITAL_BASED_OUTPATIENT_CLINIC_OR_DEPARTMENT_OTHER): Payer: Self-pay | Admitting: Obstetrics & Gynecology

## 2020-12-06 ENCOUNTER — Telehealth (HOSPITAL_BASED_OUTPATIENT_CLINIC_OR_DEPARTMENT_OTHER): Payer: Self-pay

## 2020-12-06 NOTE — Telephone Encounter (Signed)
Pt [redacted]w[redacted]d GA complaining of mild abdominal pain that started last night after having ate spicy [place] food. Describes it feeling "like an ulcer on my belly", drank lemonade and it seemed to have made it worse. Advised pt to drink plenty of water and try heartburn medication such as Pepcid or Tums after meals. To call back office if symptoms worsen. Verbalized understanding.

## 2020-12-07 ENCOUNTER — Encounter (HOSPITAL_BASED_OUTPATIENT_CLINIC_OR_DEPARTMENT_OTHER): Payer: Self-pay | Admitting: Student in an Organized Health Care Education/Training Program

## 2020-12-13 ENCOUNTER — Ambulatory Visit (HOSPITAL_BASED_OUTPATIENT_CLINIC_OR_DEPARTMENT_OTHER): Admitting: Obstetrics & Gynecology

## 2020-12-14 ENCOUNTER — Other Ambulatory Visit (HOSPITAL_BASED_OUTPATIENT_CLINIC_OR_DEPARTMENT_OTHER): Payer: Self-pay | Admitting: Student in an Organized Health Care Education/Training Program

## 2020-12-14 ENCOUNTER — Encounter (HOSPITAL_BASED_OUTPATIENT_CLINIC_OR_DEPARTMENT_OTHER): Payer: Self-pay | Admitting: Student in an Organized Health Care Education/Training Program

## 2020-12-14 ENCOUNTER — Ambulatory Visit (INDEPENDENT_AMBULATORY_CARE_PROVIDER_SITE_OTHER): Admitting: Student in an Organized Health Care Education/Training Program

## 2020-12-14 VITALS — BP 125/69 | HR 92 | Temp 97.1°F | Wt 191.0 lb

## 2020-12-14 DIAGNOSIS — O09522 Supervision of elderly multigravida, second trimester: Secondary | ICD-10-CM

## 2020-12-14 DIAGNOSIS — Z3A19 19 weeks gestation of pregnancy: Secondary | ICD-10-CM

## 2020-12-14 DIAGNOSIS — Z23 Encounter for immunization: Secondary | ICD-10-CM

## 2020-12-14 NOTE — Progress Notes (Signed)
After obtaining consent, and per orders of Dr. Youn, injection of Influenza vaccine given by Euclide Granito Diaz Rivera, RN. Patient instructed to remain in clinic for 20 minutes afterwards, and to report any adverse reaction to me immediately.

## 2020-12-14 NOTE — Progress Notes (Signed)
Heidi Espinoza is a 35 y.o. female G2P0010 at [redacted]w[redacted]d. Estimated Date of Delivery: 05/04/21. Patient reports some round ligament pain. No vb, lof or ctx. No fm yet .      Genetic Screening: afp neg. Nipt neg XX. +alpha thalassemia silent carrier  Anatomy US: has it scheduled today.  Immunizations:  - Covid: moderna x2 and booster  - Flu: today  - Tdap: needs at 27-28weeks  Work:     Pregnancy complicated by  Alpha thalassemia carrier- fob getting tested  AMA at delivery    O: BP 125/69    Pulse 92    Temp 97.1 F (36.2 C)    Wt 191 lb (86.6 kg)    LMP 07/28/2020 (Exact Date)    BMI 29.04 kg/m . See flow sheet    A/P: IUP @ [redacted]w[redacted]d  - Reviewed AFP results. Has [place] scheduled w/MFM  - recommend baby aspirin   - Follow-up: @ 24 weeks  - Next visit: fu [place] results, fob carrier status  - Questions addressed, guidance given, see AVS    Adrienne Mocha, MD

## 2020-12-15 ENCOUNTER — Encounter (HOSPITAL_BASED_OUTPATIENT_CLINIC_OR_DEPARTMENT_OTHER): Payer: Self-pay | Admitting: Student in an Organized Health Care Education/Training Program

## 2020-12-26 ENCOUNTER — Encounter (HOSPITAL_BASED_OUTPATIENT_CLINIC_OR_DEPARTMENT_OTHER): Payer: Self-pay | Admitting: Student in an Organized Health Care Education/Training Program

## 2020-12-28 ENCOUNTER — Ambulatory Visit (HOSPITAL_BASED_OUTPATIENT_CLINIC_OR_DEPARTMENT_OTHER)
Admission: RE | Admit: 2020-12-28 | Discharge: 2020-12-28 | Disposition: A | Discharge status: Home / Self Care | Source: Ambulatory Visit | Attending: Student in an Organized Health Care Education/Training Program | Admitting: Student in an Organized Health Care Education/Training Program

## 2020-12-28 ENCOUNTER — Ambulatory Visit
Admission: RE | Admit: 2020-12-28 | Discharge: 2020-12-28 | Disposition: A | Discharge status: Home / Self Care | Source: Ambulatory Visit | Attending: Student in an Organized Health Care Education/Training Program | Admitting: Student in an Organized Health Care Education/Training Program

## 2020-12-28 ENCOUNTER — Ambulatory Visit

## 2020-12-28 DIAGNOSIS — O09522 Supervision of elderly multigravida, second trimester: Secondary | ICD-10-CM | POA: Insufficient documentation

## 2020-12-28 DIAGNOSIS — O09529 Supervision of elderly multigravida, unspecified trimester: Secondary | ICD-10-CM | POA: Insufficient documentation

## 2020-12-28 NOTE — Consults (Signed)
Maternal Fetal Medicine Consultation.   [facility] PDC    Referring Provider: Berta Minor MD     [redacted]w[redacted]d  AMA    Thank you for your kind referral of Heidi Espinoza. As you are well aware she is a 35 yo G2P0010 AAF @ [redacted]w[redacted]d, [facility] 05/04/2021.  She presents for fetal anatomic survey and consultation.     This pregnancy has been otherwise uncomplicated. She reports low risk NIPT.   Today she has no complaints.     OB History   Gravida Para Term Preterm AB Living   2 0 0 0 1 0   SAB IAB Ectopic Multiple Live Births   0 1 0 0 0      # Outcome Date GA Lbr Len/2nd Weight Sex Delivery Anes PTL Lv   2 Current            1 IAB              History reviewed. No pertinent past medical history.  Past Surgical History:   Procedure Laterality Date    THYROID CYST EXCISION Right     in 3rd grade, path = benign    WISDOM TOOTH EXTRACTION Bilateral 09/2015     Current Outpatient Medications   Medication Instructions    Prenatal MV & Min w/FA-DHA (Prenatal Adult Gummy/DHA/FA) 0.4-25 MG Chew Tab Oral     Allergies   Allergen Reactions    Penicillins Hives, Other (See Comments) and Rash     Pt unsure; reaction as child       Other Environmental      seasonal     Social History     Tobacco Use    Smoking status: Never    Smokeless tobacco: Never   Vaping Use    Vaping Use: Never used   Substance Use Topics    Alcohol use: Not Currently     Comment: socially; not during pregnancy    Drug use: Never     Family History   Problem Relation Age of Onset    Stroke Mother     Hyperlipidemia Mother     Hypertension Mother     Bell's palsy Mother     Hyperlipidemia Father     Diabetes Father      Vitals: 116/76. Pulse: 90  Weight: 186lbs Height 84ft 8  inches  Gen: AOX3 NAD  Abd: Soft, NT, gravid uterus  Ext: No edema NT    Ultrasound  Singleton Intrauterine pregnancy.   Estimated fetal weight (51%)  Normal amniotic fluid index.   Anterior placenta.   Normal fetal anatomy except for sub optimal heart views.     See [facility]point report.     Assessment.    [redacted]w[redacted]d  AMA at delivery    We reviewed the US findings today. Normal fetal anatomy except for suboptimal heart views.  Fetal size is consistent with dates. Normal amniotic fluid.   Limitations of ultrasound discussed.     We reviewed advanced maternal age in pregnancy. Risks to pregnancy include but are not limited to miscarriage, aneuploidy, preterm delivery, cesarean delivery, hypertensive disorders of pregnancy, growth abnormalities, gestational DM. She is not on low dose ASA for preeclampsia prophylaxis. We discussed prevention  and she was encouraged to start though it is later in gestation.   She reports low risk  NIPT this pregnancy. Recommend return in 3 weeks to complete anatomy and a third trimester fetal growth ultrasound at 32 weeks.     Recommendations.   -  Return in 3 weeks to complete anatomy.  - Third trimester interval fetal growth at 32 weeks.     Linna Caprice MD  Maternal Fetal Medicine.

## 2021-01-02 ENCOUNTER — Telehealth (INDEPENDENT_AMBULATORY_CARE_PROVIDER_SITE_OTHER): Admitting: Nurse Practitioner

## 2021-01-02 DIAGNOSIS — O98519 Other viral diseases complicating pregnancy, unspecified trimester: Secondary | ICD-10-CM

## 2021-01-02 DIAGNOSIS — O09522 Supervision of elderly multigravida, second trimester: Secondary | ICD-10-CM

## 2021-01-02 DIAGNOSIS — U071 COVID-19: Secondary | ICD-10-CM

## 2021-01-02 MED ORDER — PAXLOVID (300/100) 20 X 150 MG & 10 X 100MG PO TBPK
3.0000 | ORAL_TABLET | Freq: Two times a day (BID) | ORAL | 0 refills | Status: AC
Start: 2021-01-02 — End: 2021-01-07

## 2021-01-02 NOTE — Progress Notes (Signed)
VIDEO VISIT    Verbal consent has been obtained from the patient to conduct a VIDEO visit to minimize exposure to COVID-19.    Heidi Espinoza is a 35 y.o. female G2P0010 at [redacted]w[redacted]d.    1.  Dx with COVID on 10/23 thru urgent care, had a fever of 101.2, with body aches, head and chest congestion and a dry cough, feeling somewhat better now with mostly head and chest congestion - reviewed Paxlovid and purpose in detail, pt hesitant, but will do more research, also reviewed OTC meds for symptom control.    Genetic Screening: afp neg. Nipt neg XX. +alpha thalassemia silent carrier  Anatomy US: WNL, needs f/u cardia views  Immunizations:  - Covid: moderna x2 and booster  - Flu: today  - Tdap: needs at 27-28weeks  Work:      Pregnancy complicated by  1.  Alpha thalassemia carrier- fob getting tested  2.  AMA at delivery  LDA  IGS 32 weeks  3.  + COVID, dx 10/23    O: LMP 07/28/2020 (Exact Date)   See flow sheet    A/P: IUP @ [redacted]w[redacted]d  1.  See notes above regarding current COVID infection.  2.  Will schedule next RPV visit for 2 weeks from now.    - Next visit review anatomy scan and f/u US.     Fidela Cieslak L. Mckennah Kretchmer, MS, RN, WHNP-BC

## 2021-01-03 ENCOUNTER — Other Ambulatory Visit (HOSPITAL_BASED_OUTPATIENT_CLINIC_OR_DEPARTMENT_OTHER): Payer: Self-pay | Admitting: Obstetrics and Gynecology

## 2021-01-03 DIAGNOSIS — O09521 Supervision of elderly multigravida, first trimester: Secondary | ICD-10-CM

## 2021-01-20 ENCOUNTER — Other Ambulatory Visit (HOSPITAL_BASED_OUTPATIENT_CLINIC_OR_DEPARTMENT_OTHER): Payer: Self-pay | Admitting: Obstetrics and Gynecology

## 2021-01-20 ENCOUNTER — Ambulatory Visit
Admission: RE | Admit: 2021-01-20 | Discharge: 2021-01-20 | Disposition: A | Discharge status: Home / Self Care | Source: Ambulatory Visit | Attending: Obstetrics and Gynecology | Admitting: Obstetrics and Gynecology

## 2021-01-20 DIAGNOSIS — O09521 Supervision of elderly multigravida, first trimester: Secondary | ICD-10-CM

## 2021-01-20 DIAGNOSIS — Z3A25 25 weeks gestation of pregnancy: Secondary | ICD-10-CM | POA: Insufficient documentation

## 2021-01-20 DIAGNOSIS — O09512 Supervision of elderly primigravida, second trimester: Secondary | ICD-10-CM

## 2021-01-20 DIAGNOSIS — O09522 Supervision of elderly multigravida, second trimester: Secondary | ICD-10-CM | POA: Insufficient documentation

## 2021-02-27 ENCOUNTER — Ambulatory Visit (INDEPENDENT_AMBULATORY_CARE_PROVIDER_SITE_OTHER): Admitting: Nurse Practitioner

## 2021-02-27 ENCOUNTER — Encounter (HOSPITAL_BASED_OUTPATIENT_CLINIC_OR_DEPARTMENT_OTHER): Payer: Self-pay | Admitting: Nurse Practitioner

## 2021-02-27 VITALS — BP 122/77 | HR 104 | Temp 97.7°F | Wt 190.8 lb

## 2021-02-27 DIAGNOSIS — Z23 Encounter for immunization: Secondary | ICD-10-CM

## 2021-02-27 DIAGNOSIS — O09529 Supervision of elderly multigravida, unspecified trimester: Secondary | ICD-10-CM

## 2021-02-27 LAB — CBC
Absolute NRBC: 0 10*3/uL (ref 0.00–0.00)
Hematocrit: 34.6 % — ABNORMAL LOW (ref 34.7–43.7)
Hgb: 10.8 g/dL — ABNORMAL LOW (ref 11.4–14.8)
MCH: 24.9 pg — ABNORMAL LOW (ref 25.1–33.5)
MCHC: 31.2 g/dL — ABNORMAL LOW (ref 31.5–35.8)
MCV: 79.9 fL (ref 78.0–96.0)
MPV: 11 fL (ref 8.9–12.5)
Nucleated RBC: 0 /100 WBC (ref 0.0–0.0)
Platelets: 230 10*3/uL (ref 142–346)
RBC: 4.33 10*6/uL (ref 3.90–5.10)
RDW: 15 % (ref 11–15)
WBC: 6.37 10*3/uL (ref 3.10–9.50)

## 2021-02-27 LAB — GLUCOSE CHALLENGE: Glucose Challenge: 98 mg/dL

## 2021-02-27 MED ORDER — BREAST PUMP MISC
0 refills | Status: DC
Start: 2021-02-27 — End: 2021-05-04

## 2021-02-27 NOTE — Progress Notes (Signed)
.  INJECTION  Medication Name: Tdap  Site: Right deltoid    After obtaining consent and orders per NP Folks, injection of Tdap given by [facility] F[facility]RMA.   Patient states there are No Known Allergies. Patient instructed to remain in clinic for 15 minutes afterwards, and report any adverse reaction to me immediately.  Completed by: HF.

## 2021-02-27 NOTE — Patient Instructions (Signed)
Your Pregnancy: Weeks 26-30  The third trimester starts at 28 weeks. Things to expect during your 28 week appointment include:  By now you should have completed your 1 hour glucose screening test for gestational diabetes.   If you have Rh negative blood type you will need Rhogam injection at 28 weeks.  Tdap (Tetnus, diphtheria and acellular pertussis) vaccination is recommended in every pregnancy. This is usually given in the 3rd trimester.   Anyone who will be caring for your child should have an updated Tdap and flu vaccination.     You will also have your blood drawn for a blood count and repeat infectious blood work during the third trimester.   Things to consider at this point in your pregnancy:   Have you decided to breast feed?   While we are aware of the many benefits of breast feeding and are here to support you in your decision, fed is best!    We have many resources to help you during the early weeks of breast feeding and beyond.   If you are having a son will you be circumcising?   Circumcision is a personal choice and is often done for cultural reasons   From the AAP: After a comprehensive review of the scientific evidence, the American Academy of Pediatrics found the health benefits of newborn female circumcision outweigh the risks, but the benefits are not great enough to recommend universal newborn circumcision. The AAP policy statement, says the final decision should still be left to parents to make in the context of their religious, ethical and cultural beliefs.  Who will you want to be there during your delivery?   If you have other children who will take care of them while you are in the hospital?  Your Pregnancy: Weeks 26-30  The third trimester starts at 28 weeks. Things to expect during your 28 week appointment include:  By now you should have completed your 1 hour glucose screening test for gestational diabetes.   If you have Rh negative blood type you will need Rhogam injection at 28  weeks.  Tdap (Tetnus, diphtheria and acellular pertussis) vaccination is recommended in every pregnancy. This is usually given in the 3rd trimester.   Anyone who will be caring for your child should have an updated Tdap and flu vaccination.     You will also have your blood drawn for a blood count and repeat infectious blood work during the third trimester.   Things to consider at this point in your pregnancy:   Have you decided to breast feed?   While we are aware of the many benefits of breast feeding and are here to support you in your decision, fed is best!    We have many resources to help you during the early weeks of breast feeding and beyond.   If you are having a son will you be circumcising?   Circumcision is a personal choice and is often done for cultural reasons   From the AAP: After a comprehensive review of the scientific evidence, the American Academy of Pediatrics found the health benefits of newborn female circumcision outweigh the risks, but the benefits are not great enough to recommend universal newborn circumcision. The AAP policy statement, says the final decision should still be left to parents to make in the context of their religious, ethical and cultural beliefs.  Who will you want to be there during your delivery?   If you have other children who will take care  of them while you are in the hospital?

## 2021-02-27 NOTE — Progress Notes (Signed)
Heidi Espinoza is a 35 y.o. female G2P0010 at [redacted]w[redacted]d.  FINAL EDC 05/04/2021.    1.  Not seen in 2 months, thought her MFM visit counted as a visit.    Genetic Screening: Nipt neg XX;  +alpha thalassemia silent carrier (FOB negative); AFP negative  Anatomy US: WNL, f/u heart views WNL  Immunizations:  - Covid: moderna x2 and booster  - Flu: 10/5  - Tdap: 12/19     Pregnancy complicated by  1.  Alpha thalassemia carrier- fob testing negative  2.  AMA at delivery  LDA  IGS 32 weeks (12/28)  3.  + COVID, dx 10/23    O: BP 122/77    Pulse (!) 104    Temp 97.7 F (36.5 C)    Wt 190 lb 12.8 oz (86.5 kg)    LMP 07/28/2020 (Exact Date)    BMI 29.01 kg/m   See flow sheet    A/P: IUP @ [redacted]w[redacted]d  1.  Reviewed anatomy scan and f/u results.  2.  Reviewed FOB carrier screening results.  3.  PGS and CBC today.  4.  Tdap given today.  5.  Breast pump Rx given.  Reviewed peds and birthing classes.  6.  Answered questions regarding weight gain, FW, FM, LDA and delivering providers.  7.  F/u 2 weeks.    - Next visit review PGS and CBC results.     Jaivian Battaglini L. Kelsy Polack, MS, RN, WHNP-BC

## 2021-03-08 ENCOUNTER — Ambulatory Visit
Admission: RE | Admit: 2021-03-08 | Discharge: 2021-03-08 | Disposition: A | Discharge status: Home / Self Care | Source: Ambulatory Visit | Attending: Obstetrics and Gynecology | Admitting: Obstetrics and Gynecology

## 2021-03-08 DIAGNOSIS — Z3A31 31 weeks gestation of pregnancy: Secondary | ICD-10-CM | POA: Insufficient documentation

## 2021-03-08 DIAGNOSIS — O09512 Supervision of elderly primigravida, second trimester: Secondary | ICD-10-CM

## 2021-03-08 DIAGNOSIS — O09523 Supervision of elderly multigravida, third trimester: Secondary | ICD-10-CM | POA: Insufficient documentation

## 2021-03-12 NOTE — L&D Delivery Note (Signed)
VAGINAL DELIVERY PROCEDURE NOTE   [facility] - OB/GYN      Patient Name: Heidi Espinoza, Heidi Espinoza  MRN: [id number] / DOB: [birth year]     Date: 05/02/2021   Prenatal Care Location: [facility]    Diagnosis:   36 y/o G2P0010 with IUP @ [redacted]w[redacted]d  IOL for gestational HTN (transient)  Advanced maternal age  Alpha thalassemia  Prolonged 2nd stage   NRFHT (terminal end of 2nd stage)    Procedure(s):   Spontaneous Vaginal Delivery  Repair of Laceration/episiotomy    Brief Notes:   This 36 y.o. y/o G68P0010 female @ [redacted]w[redacted]d presented to Labor and Delivery for: induction for gestational HTN (transient). She received multiple agents for ripening starting on 2/17: Cytotec 50 mcg buccal x5 doses, Cervidil x1, Cook's ripening balloon. Ripening process took 3 days. She was started on Oxytocin per low-dose protocol, Cook's balloon expelled and then SROM'd at ~0530 on 05/01/21. She progressed to active labor and then to complete and pushing.  See prior notes re: status of pushing.     Of note, patient had significant anxiety related to pain/cramping and difficulty with pushing. She received 2 boluses from Anesthesia per request. This was helpful to continue pushing with better effort. She was nearly crowning for >1 hour (initially was OP and rotated to OA during the prolonged 2nd stage). Category II tracing during 2nd stage but showed no signs of fetal acidosis; noted to have moderate variability with varying baseline throughout. In the last 15 minutes, fetus developed a non-reassuring tracing with loss of variability and decels with tachycardia. This occurred after crowning. Delivery was expedited with RML episiotomy and Ritgen maneuver. NICU was called prior to delivery in anticipation of vacuum (not used) and for NRFHT.     Spontaneous vaginal delivery of viable female  "Lenon Ahmadi" from cephalic presentation and left occiput anterior position. Nuchal cord reduced x1. Shoulders delivered easily. Baby to maternal abdomen with no effort  for cry and appearing atonic. 3 vessel cord double immediately clamped and cut by MD and infant handed off to NICU team in attendance. Cord blood obtained due to maternal blood type. Cord gases also obtained Placenta delivered spontaneously with gentile downward traction with membranes trailing. Placenta noted to be complete and intact with normal cord insertion.  Fundus firming to massage at umbilicus. Pitocin 30 units in 500cc's infusing for good uterine response. Perineum inspected, Laceration Type: 2nd  degree perineal which was repaired with 3-0 Vicryl on CT in usual fashion. Good hemostasis and approximation of tissues with restoration of normal anatomy achieved. Mom and baby stable and bonding. Counts complete and correct.    Rupture date/time: 05/01/21   / Rupture type: Spontaneous / Fluid color: Clear  Date/Time of Delivery: 05/02/2021  @ 2:56 AM     APGARs: pending per NICU  1 Minute Apgar:       5 Minute Apgar:     10 Minute Apgar:  15 Minute Apgar:     Cord gases: arterial pH 7.30, BE -3.7        Birth Weight:       (pending)    Estimated Blood Loss: Estimated Blood Loss: 300 mL (Filed from Delivery Summary)  Anesthesia: Epidural    Additional Notes:   - Prolonged 2nd stage: pushed ~5 hours (3 hrs w/ adequate pushing, 30 minute break, 1.5 hrs with inadequate pushing)  - NRFHT: NICU present, RMLE and Ritgen, cord gas  - Uterotonic and Hemorrhage meds: TXA 1 gram at cord clamp, Cytotec  800 mcg PR  - Placenta to pathology  - Tmax 99.9, no e/o infection    Providers Performing:   Idelle Leech, MD, MD Evern Core

## 2021-03-14 ENCOUNTER — Encounter (HOSPITAL_BASED_OUTPATIENT_CLINIC_OR_DEPARTMENT_OTHER): Payer: Self-pay | Admitting: Nurse Practitioner

## 2021-03-14 ENCOUNTER — Ambulatory Visit (INDEPENDENT_AMBULATORY_CARE_PROVIDER_SITE_OTHER): Admitting: Nurse Practitioner

## 2021-03-14 VITALS — BP 125/75 | HR 107 | Temp 97.5°F | Wt 194.4 lb

## 2021-03-14 DIAGNOSIS — O09529 Supervision of elderly multigravida, unspecified trimester: Secondary | ICD-10-CM

## 2021-03-14 NOTE — Progress Notes (Signed)
Tamaya Pun is a 36 y.o. female G2P0010 at [redacted]w[redacted]d  Estimated Date of Delivery: 05/04/21.    1.  C/o noticing dried white stuff on nipple while showering this AM, washed off easily - pt reassured.  2.  Also c/o cramping, no particular pattern, only drinking 2 bottles of water per day - reviewed adequate daily water intake, reviewed PTL precautions.    Genetic Screening: Nipt neg XX;  +alpha thalassemia silent carrier (FOB negative); AFP negative  Anatomy US: WNL, f/u heart views WNL  Immunizations:  - Covid: moderna x2 and booster  - Flu: 10/5  - Tdap: 12/19     Pregnancy complicated by  1.  Alpha thalassemia carrier- fob testing negative  2.  AMA at delivery  LDA  IGS 32 weeks (EFW 1826 grams, 35%), f/u prn  3.  + COVID, dx 10/23    O: BP 125/75   Pulse (!) 107   Temp 97.5 F (36.4 C)   Wt 194 lb 6.4 oz (88.2 kg)   LMP 07/28/2020 (Exact Date)   BMI 29.56 kg/m   See flow sheet    MFM (12/28) - vertex, AFI 15.1 cms, EFW 1826 grams, 35%, f/u prn.    A/P: IUP @ [redacted]w[redacted]d  1.  Reviewed MFM visit.  2.  Reviewed FH and what to expect at each visit.  3.  F/u 2 weeks.    - Next visit routine care.     Avyn Aden L. Joaovictor Krone, MS, RN, WHNP-BC

## 2021-03-14 NOTE — Patient Instructions (Signed)
Your Pregnancy Weeks 32-34  Your baby continues to grow significantly each week.  As you get closer to delivery here is some information about preparing for labor and delivery.   What will happen when you go into labor?   Signs of labor: contractions, tightening of your abdomen, or low back pain that comes and goes. You should start timing contractions and when they are 5 minutes apart for two hours notify the doctor on call.   Reasons to call:   If you think your water broke you should put on a pad and walk around if the leaking continues please call. If you are unsure and are concerned your water broke please call.   If you have vaginal bleeding that is more than spotting. It is common in the third trimester to have spotting. Heavy vaginal bleeding is not normal and needs evaluation right away.   If your baby is not moving well please call right away   Routine signs of labor that do not need a call to your doctor:  If you think your mucous plug came out this may be a sign that labor is near but does not require a call to the on call physician.   Light spotting especially after intercourse. As the cervix starts to change there is often a small amount of bleeding. If the bleeding becomes heavier like a period you will need to call to speak with the doctor.   Once you speak with the physician on call you will likely be instructed to go to [facility] for evaluation. You will need to head to Labor and Delivery where you will check in. The nurses will assess you and place you on the monitor to check on your baby.   Things to consider at this point in your pregnancy:   You may wish to start thinking about what to pack for the hospital some things you may find helpful to have in a bag ready for the hospital  Tooth brush + tooth paste  Pajamas or other comfortable clothing, nursing tops or bras  Shampoo and other bathroom toiletries you use regularly   Phone charger, phone numbers of loved ones you may wish  to call after the birth of your baby  Clothes and toiletries for your partner  Breast feeding pillow  Your own breast pump if you would like help learning to use it   Comfortable shoes or slippers

## 2021-03-28 ENCOUNTER — Encounter (HOSPITAL_BASED_OUTPATIENT_CLINIC_OR_DEPARTMENT_OTHER): Payer: Self-pay | Admitting: Student in an Organized Health Care Education/Training Program

## 2021-03-28 ENCOUNTER — Ambulatory Visit (INDEPENDENT_AMBULATORY_CARE_PROVIDER_SITE_OTHER): Admitting: Student in an Organized Health Care Education/Training Program

## 2021-03-28 VITALS — BP 123/83 | HR 96 | Temp 97.7°F | Wt 194.6 lb

## 2021-03-28 DIAGNOSIS — O09529 Supervision of elderly multigravida, unspecified trimester: Secondary | ICD-10-CM

## 2021-03-28 DIAGNOSIS — D563 Thalassemia minor: Secondary | ICD-10-CM | POA: Insufficient documentation

## 2021-03-28 DIAGNOSIS — O0993 Supervision of high risk pregnancy, unspecified, third trimester: Secondary | ICD-10-CM

## 2021-03-28 NOTE — Progress Notes (Signed)
Heidi Espinoza is a 36 y.o. female G2P0010 at [redacted]w[redacted]d. Estimated Date of Delivery: 05/04/21. Patient reports good FM. No lof, vb or ctx.     Genetic Screening: Nipt neg XX;  +alpha thalassemia silent carrier (FOB negative); AFP negative  Anatomy US: WNL, f/u heart views WNL  Immunizations:  - Covid: moderna x2 and booster  - Flu: 10/5  - Tdap: 12/19     Pregnancy complicated by  1.  Alpha thalassemia carrier- fob testing negative  2.  AMA at delivery  LDA  IGS 32 weeks (EFW 1826 grams, 35%), f/u prn  3.  + COVID, dx 10/23    O: BP 123/83   Pulse 96   Temp 97.7 F (36.5 C)   Wt 194 lb 9.6 oz (88.3 kg)   LMP 07/28/2020 (Exact Date)   BMI 29.59 kg/m . See flow sheet    A/P: IUP @ [redacted]w[redacted]d  - Reviewed 2nd/3rd trimester labs  - Discussed maternity leave, advise to bring next OV.   - Discussed perineal massages and s/sx of preeclampsia.  - TWG: 8 lb 9.6 oz (3.901 kg)   - Follow-up: @ 36 weeks  - Next visit: maternity leave paperwork,     Adrienne Mocha, MD

## 2021-04-04 ENCOUNTER — Ambulatory Visit (INDEPENDENT_AMBULATORY_CARE_PROVIDER_SITE_OTHER): Admitting: Nurse Practitioner

## 2021-04-04 ENCOUNTER — Encounter (HOSPITAL_BASED_OUTPATIENT_CLINIC_OR_DEPARTMENT_OTHER): Payer: Self-pay | Admitting: Nurse Practitioner

## 2021-04-04 VITALS — BP 130/78 | HR 105 | Temp 97.6°F | Wt 198.2 lb

## 2021-04-04 DIAGNOSIS — O09529 Supervision of elderly multigravida, unspecified trimester: Secondary | ICD-10-CM

## 2021-04-04 NOTE — Progress Notes (Signed)
Heidi Espinoza is a 36 y.o. female G2P0010 at [redacted]w[redacted]d. Estimated Date of Delivery: 05/04/21.  No complaints.    Genetic Screening: Nipt neg XX;  +alpha thalassemia silent carrier (FOB negative); AFP negative  Anatomy US: WNL, f/u heart views WNL  Immunizations:  - Covid: moderna x2 and booster  - Flu: 10/5  - Tdap: 12/19     Pregnancy complicated by  1.  Alpha thalassemia carrier- fob testing negative  2.  AMA at delivery  LDA  IGS 32 weeks (EFW 1826 grams, 35%), f/u prn  3.  + COVID, dx 10/23    O: BP 130/78   Pulse (!) 105   Temp 97.6 F (36.4 C)   Wt 198 lb 3.2 oz (89.9 kg)   LMP 07/28/2020 (Exact Date)   BMI 30.14 kg/m   See flow sheet    A/P: IUP @ [redacted]w[redacted]d  1.  Discussed taking time off before delivery vs after delivery.  2.  F/u 1 week.    - Next visit GBS/SVE.     [name] L. Oretha Weismann, MS, RN, WHNP-BC

## 2021-04-04 NOTE — Patient Instructions (Signed)
Your Pregnancy: Weeks 34-36:     Around 36 weeks your doctor will collect a sample to test for Group B Streptococcus bacteria.     Group B streptococcus (GBS) is a type of bacteria that is found in 10-30% of pregnant women. A woman with GBS can pass it to her baby during labor and delivery. Most babies who get GBS from their mothers do not have any problems. A few, however, will become sick. This illness can cause serious health problems and even death in newborn babies. It usually can be prevented with a routine screening test that is given during prenatal care.  This pamphlet explains  how GBS may affect a newborn  testing and treatment for GBS  treatment in special situations  What Is GBS?  GBS is one of the many bacteria that live in the body and usually do not cause serious illness. It is found in the digestive, urinary, and reproductive tracts of men and women. In women, it can be found in the vagina and rectum. GBS is not a sexually transmitted infection. Also, although the names are similar, GBS is different from group A streptococcus, the bacteria that causes "strep throat."  A person who has the bacteria but shows no symptoms is said to be colonized. The number of bacteria that a person has may change over time. A person colonized with a large number of bacteria may have low levels of bacteria months or years later. It also is possible for the number of bacteria to decrease to levels that cannot be detected.  Most pregnant women who are colonized with GBS have no symptoms or health effects. In a small number of women, GBS can cause infections of the urinary tract and uterus. Sometimes, these infections can become serious. A woman who is colonized with GBS late in her pregnancy can pass it to her baby. For this reason, women are tested for GBS late in pregnancy. If GBS is present, a woman will receive treatment during labor.  Effects on the Newborn  There are two types of GBS infections in newborns.  Although both types of infections can be serious, most infants recover with no lasting effects. However, about 5% of babies infected with GBS will die.  Early-onset infections--Early-onset infections occur during the first week of life, generally within the first 24-48 hours after birth. These infections can occur as the baby moves through the birth canal of a woman who is colonized with GBS. Only a few babies who are exposed to GBS develop an infection. Certain factors, such as preterm birth, may increase the risk of a baby becoming infected. The most common problems caused by early-onset GBS infections are lung infections, blood infections, and meningitis.  Late-onset infections--These infections occur after the first 6 days of life. Late-onset infections may be passed from the mother to the baby during birth or they may be caused by contact with other people who are colonized with GBS. Late-onset infection can lead to meningitis and other diseases, such as pneumonia (see box "Signs and Symptoms of Late-Onset Infection").  GBS testing late in pregnancy and treatment during labor can help prevent early-onset infections. However, it does not prevent late-onset infections.  Signs and Symptoms of Late-Onset Infection  Although treatment with antibiotics during labor helps prevent early-onset GBS infection, it does not prevent late-onset GBS infection. Babies may pick up GBS from people they come in contact with or through other means. The antibiotic given during birth does not protect a   baby from these kinds of GBS infections. Late-onset GBS infection most commonly causes meningitis. In newborns, the signs and symptoms of meningitis can be hard to spot. If your baby has any of the following signs or symptoms, contact your pediatrician right away:  Slowness or inactivity  Irritability  Poor feeding  Vomiting  High fever   Testing and Treatment  To help prevent early-onset GBS infection, women are tested for GBS late in  pregnancy, between weeks 35 and 37. The test is called a culture. In this test, a swab is used to take a sample from the woman's vagina and rectum. This procedure is quick and is not painful. The sample is sent to a lab where it is grown in a special substance. It may take up to 2 days to get the results.  If results of the culture test are positive, showing that GBS is present, you most likely will receive treatment withantibiotics during labor to help prevent GBS from being passed to your baby. Antibiotics help get rid of some of the bacteria that can harm the baby during birth. The antibiotics work only if they are given during labor. If treatment is given earlier in pregnancy, the bacteria may regrow and be present during labor.  Even if you had a negative GBS test result in a previous pregnancy, you still need to be tested during each pregnancy. If you had a positive GBS test result in a prior pregnancy, you need to be tested again during each pregnancy. You may no longer have the bacteria.  Penicillin is the antibiotic that is most often given to prevent early-onset GBS infection in newborns. If you are allergic to penicillin, tell your health care provider before you are tested for GBS. Women with mild allergic reactions can take an antibiotic called cefazolin. If you have had a severe reaction to penicillin, such as hives or anaphylaxis, the bacteria in the sample need to be tested to determine the choice of antibiotic.  If you had a previous baby with GBS infection or if your urine has GBS bacteria during this pregnancy, you are at high risk of passing GBS on to your baby during labor and delivery. You will receive treatment during labor to protect your baby from infection. You will not need to be tested between weeks 35 and 37 of pregnancy.  Do You Need Treatment for GBS During Labor and Delivery?  You will need treatment for GBS during labor and delivery if you have any of the following:  A previous baby  with GBS infection  Presence of GBS in the urine during this pregnancy  A positive culture test result during this pregnancy  Unknown GBS status (you did not have a GBS culture test during this pregnancy, the test was not complete, or the results are not known) with any of the following:  You go into labor at less than 37 weeks of pregnancy  Your water breaks 18 hours or more before delivery  You have a fever during labor  You will not need treatment for GBS during labor and delivery if you have  a planned cesarean delivery done before your labor starts or water breaks (even if you are GBS positive)  a negative result from a GBS test done between weeks 35 and 37 of this pregnancy   Special Situations  Women who have planned a cesarean birth do not need to be given antibiotics for GBS during delivery if their labor has not begun or the   amniotic sac has not ruptured (their water has not broken). However, these women should still be tested for GBS because labor may occur before the planned cesarean birth. If the test result is positive, the baby may need to be monitored for GBS infection after birth.  If a woman goes into labor but has not yet been tested for GBS, she may be given antibiotics in certain situations. The box "Do You Need Treatment for GBS During Labor and Delivery?" lists some of the different situations in which antibiotic treatment for GBS is and is not needed.  Finally.  GBS can cause serious health problems in newborns. It is important to know about GBS so that you can protect your baby. Pregnant women are tested for GBS late in pregnancy. If you are GBS positive, treatment during labor and delivery may help prevent early-onset GBS infection in your baby. Tell your health care provider about your GBS status in past pregnancies, whether you have had a baby infected with GBS, and whether you are allergic to penicillin.

## 2021-04-07 ENCOUNTER — Encounter (HOSPITAL_BASED_OUTPATIENT_CLINIC_OR_DEPARTMENT_OTHER): Payer: Self-pay | Admitting: Obstetrics & Gynecology

## 2021-04-07 DIAGNOSIS — Z029 Encounter for administrative examinations, unspecified: Secondary | ICD-10-CM

## 2021-04-11 ENCOUNTER — Ambulatory Visit (INDEPENDENT_AMBULATORY_CARE_PROVIDER_SITE_OTHER): Admitting: Nurse Practitioner

## 2021-04-11 ENCOUNTER — Encounter (HOSPITAL_BASED_OUTPATIENT_CLINIC_OR_DEPARTMENT_OTHER): Payer: Self-pay | Admitting: Nurse Practitioner

## 2021-04-11 VITALS — BP 123/82 | HR 111 | Temp 97.5°F | Wt 199.0 lb

## 2021-04-11 DIAGNOSIS — O09529 Supervision of elderly multigravida, unspecified trimester: Secondary | ICD-10-CM

## 2021-04-11 DIAGNOSIS — N644 Mastodynia: Secondary | ICD-10-CM

## 2021-04-11 NOTE — Progress Notes (Signed)
error 

## 2021-04-11 NOTE — Progress Notes (Signed)
Heidi Espinoza is a 36 y.o. female G2P0010 at [redacted]w[redacted]d.  Estimated Date of Delivery: 05/04/21.     1.  C/o severe right breast and nipple pain that began on 1/29 at 0400, comes and goes, nipple feels like a razor nicked it, the right breast also feels heavier - exam WNL, right breast fuller than left breast - will monitor for now and if it worsens, will send for breast US, given order to have, recommended Vitamin E oil  2.  Also c/o pedal edema after walking a lot, but goes away on its own - pt reassured.  3.  Also c/o anxiety about upcoming delivery, chest feels tight at times - reviewed breathing techniques and other remedies.    Genetic Screening: Nipt neg XX;  +alpha thalassemia silent carrier (FOB negative); AFP negative  Anatomy US: WNL, f/u heart views WNL  Immunizations:  - Covid: moderna x2 and booster  - Flu: 10/5  - Tdap: 12/19     Pregnancy complicated by  1.  Alpha thalassemia carrier- fob testing negative  2.  AMA at delivery  LDA  IGS 32 weeks (EFW 1826 grams, 35%), f/u prn  3.  + COVID, dx 10/23    O: BP 123/82   Pulse (!) 111   Temp 97.5 F (36.4 C)   Wt 199 lb (90.3 kg)   LMP 07/28/2020 (Exact Date)   BMI 30.26 kg/m   See flow sheet  SVE closed/50/-3    A/P: IUP @ [redacted]w[redacted]d  1.  GBS culture today.  2.  Discussed weight gain and what to bring to hospital.  3.  Labor precautions given.  4.  F/u 1 week.    - Next visit review GBS results/consents/delivery plan.     Chisa Kushner L. Camaron Cammack, MS, RN, WHNP-BC

## 2021-04-18 ENCOUNTER — Ambulatory Visit (HOSPITAL_BASED_OUTPATIENT_CLINIC_OR_DEPARTMENT_OTHER): Admitting: Nurse Practitioner

## 2021-04-18 ENCOUNTER — Encounter (HOSPITAL_BASED_OUTPATIENT_CLINIC_OR_DEPARTMENT_OTHER): Payer: Self-pay | Admitting: Nurse Practitioner

## 2021-04-18 ENCOUNTER — Ambulatory Visit
Admission: RE | Admit: 2021-04-18 | Discharge: 2021-04-18 | Disposition: A | Discharge status: Home / Self Care | Source: Ambulatory Visit | Attending: Nurse Practitioner | Admitting: Nurse Practitioner

## 2021-04-18 ENCOUNTER — Ambulatory Visit (INDEPENDENT_AMBULATORY_CARE_PROVIDER_SITE_OTHER): Admitting: Nurse Practitioner

## 2021-04-18 VITALS — BP 118/73 | HR 73 | Temp 97.8°F | Wt 204.6 lb

## 2021-04-18 DIAGNOSIS — N644 Mastodynia: Secondary | ICD-10-CM | POA: Insufficient documentation

## 2021-04-18 DIAGNOSIS — O09529 Supervision of elderly multigravida, unspecified trimester: Secondary | ICD-10-CM

## 2021-04-18 NOTE — Progress Notes (Signed)
Heidi Espinoza is a 36 y.o. female G2P0010 at [redacted]w[redacted]d.  Estimated Date of Delivery: 05/04/21.      1.  Reports breast pain is now gone - reviewed normal breast US.    Genetic Screening: Nipt neg XX;  +alpha thalassemia silent carrier (FOB negative); AFP negative  Anatomy US: WNL, f/u heart views WNL  Immunizations:  - Covid: moderna x2 and booster  - Flu: 10/5  - Tdap: 12/19     Pregnancy complicated by  1.  Alpha thalassemia carrier- fob testing negative  2.  AMA at delivery  LDA  IGS 32 weeks (EFW 1826 grams, 35%), f/u prn  3.  + COVID, dx 10/23    Delivery Plan: IOL at 40 weeks, request submitted  GBS negative  Consents signed 2/7    O: BP 118/73   Pulse 73   Temp 97.8 F (36.6 C)   Wt 204 lb 9.6 oz (92.8 kg)   LMP 07/28/2020 (Exact Date)   BMI 31.11 kg/m   See flow sheet    A/P: IUP @ [redacted]w[redacted]d  1.  Reviewed GBS negative results.  2.  Discussed delivery plan - see notes above.  3.  Labor precautions given.  4.  F/u 1 week.    - Next visit routine care.     Jeoffrey Eleazer L. Elieser Tetrick, MS, RN, WHNP-BC

## 2021-04-19 ENCOUNTER — Encounter (HOSPITAL_BASED_OUTPATIENT_CLINIC_OR_DEPARTMENT_OTHER): Payer: Self-pay | Admitting: Nurse Practitioner

## 2021-04-26 ENCOUNTER — Encounter (HOSPITAL_BASED_OUTPATIENT_CLINIC_OR_DEPARTMENT_OTHER): Payer: Self-pay | Admitting: Obstetrics & Gynecology

## 2021-04-26 ENCOUNTER — Ambulatory Visit (INDEPENDENT_AMBULATORY_CARE_PROVIDER_SITE_OTHER): Admitting: Obstetrics & Gynecology

## 2021-04-26 ENCOUNTER — Encounter (HOSPITAL_BASED_OUTPATIENT_CLINIC_OR_DEPARTMENT_OTHER): Payer: Self-pay | Admitting: Student in an Organized Health Care Education/Training Program

## 2021-04-26 ENCOUNTER — Other Ambulatory Visit: Payer: Self-pay | Admitting: Student in an Organized Health Care Education/Training Program

## 2021-04-26 ENCOUNTER — Observation Stay
Admission: AD | Admit: 2021-04-26 | Discharge: 2021-04-26 | Disposition: A | Discharge status: Home / Self Care | Source: Ambulatory Visit | Attending: Student in an Organized Health Care Education/Training Program | Admitting: Student in an Organized Health Care Education/Training Program

## 2021-04-26 ENCOUNTER — Encounter (HOSPITAL_BASED_OUTPATIENT_CLINIC_OR_DEPARTMENT_OTHER): Payer: Self-pay

## 2021-04-26 ENCOUNTER — Encounter: Payer: Self-pay | Admitting: Student in an Organized Health Care Education/Training Program

## 2021-04-26 VITALS — BP 163/76 | HR 103 | Temp 97.3°F | Wt 207.2 lb

## 2021-04-26 DIAGNOSIS — F419 Anxiety disorder, unspecified: Secondary | ICD-10-CM | POA: Insufficient documentation

## 2021-04-26 DIAGNOSIS — Z3A38 38 weeks gestation of pregnancy: Secondary | ICD-10-CM

## 2021-04-26 DIAGNOSIS — O99343 Other mental disorders complicating pregnancy, third trimester: Secondary | ICD-10-CM | POA: Insufficient documentation

## 2021-04-26 DIAGNOSIS — Z3493 Encounter for supervision of normal pregnancy, unspecified, third trimester: Secondary | ICD-10-CM

## 2021-04-26 DIAGNOSIS — O09523 Supervision of elderly multigravida, third trimester: Secondary | ICD-10-CM | POA: Insufficient documentation

## 2021-04-26 DIAGNOSIS — O163 Unspecified maternal hypertension, third trimester: Secondary | ICD-10-CM | POA: Diagnosis present

## 2021-04-26 DIAGNOSIS — Z8616 Personal history of COVID-19: Secondary | ICD-10-CM | POA: Insufficient documentation

## 2021-04-26 DIAGNOSIS — O26893 Other specified pregnancy related conditions, third trimester: Principal | ICD-10-CM | POA: Insufficient documentation

## 2021-04-26 DIAGNOSIS — R030 Elevated blood-pressure reading, without diagnosis of hypertension: Secondary | ICD-10-CM | POA: Insufficient documentation

## 2021-04-26 HISTORY — DX: Disorder of thyroid, unspecified: E07.9

## 2021-04-26 HISTORY — DX: Essential (primary) hypertension: I10

## 2021-04-26 LAB — URIC ACID: Uric acid: 3.9 mg/dL (ref 2.6–7.1)

## 2021-04-26 LAB — PROTEIN / CREATININE RATIO, URINE
Urine Creatinine, Random: 18.2 mg/dL
Urine Protein Random: <6.8 mg/dL (ref 1.0–14.0)

## 2021-04-26 LAB — COMPREHENSIVE METABOLIC PANEL
ALT: 10 U/L (ref 0–55)
AST (SGOT): 15 U/L (ref 5–41)
Albumin/Globulin Ratio: 0.9 (ref 0.9–2.2)
Albumin: 2.8 g/dL — ABNORMAL LOW (ref 3.5–5.0)
Alkaline Phosphatase: 131 U/L — ABNORMAL HIGH (ref 37–117)
Anion Gap: 9 (ref 5.0–15.0)
BUN: 4 mg/dL — ABNORMAL LOW (ref 7.0–21.0)
Bilirubin, Total: 0.6 mg/dL (ref 0.2–1.2)
CO2: 25 mEq/L (ref 17–29)
Calcium: 9 mg/dL (ref 8.5–10.5)
Chloride: 109 mEq/L (ref 99–111)
Creatinine: 0.6 mg/dL (ref 0.4–1.0)
Globulin: 3.1 g/dL (ref 2.0–3.6)
Glucose: 88 mg/dL (ref 70–100)
Potassium: 3.3 mEq/L — ABNORMAL LOW (ref 3.5–5.3)
Protein, Total: 5.9 g/dL — ABNORMAL LOW (ref 6.0–8.3)
Sodium: 143 mEq/L (ref 135–145)

## 2021-04-26 LAB — POCT URINALYSIS DIPSTIX (10)(MULTI-TEST)
Bilirubin, UA POCT: NEGATIVE
Blood, UA POCT: NEGATIVE
Glucose, UA POCT: 100 mg/dL — AB
Ketones, UA POCT: NEGATIVE mg/dL
Nitrite, UA POCT: NEGATIVE
POCT Leukocytes, UA: NEGATIVE
POCT Spec Gravity, UA: 1.03 (ref 1.001–1.035)
POCT pH, UA: 6 (ref 5–8)
Urobilinogen, UA: 0.2 mg/dL

## 2021-04-26 LAB — URINALYSIS REFLEX TO MICROSCOPIC EXAM - REFLEX TO CULTURE
Bilirubin, UA: NEGATIVE
Blood, UA: NEGATIVE
Glucose, UA: NEGATIVE
Ketones UA: NEGATIVE
Leukocyte Esterase, UA: NEGATIVE
Nitrite, UA: NEGATIVE
Protein, UR: NEGATIVE
Specific Gravity UA: 1.002 (ref 1.001–1.035)
Urine pH: 7 (ref 5.0–8.0)
Urobilinogen, UA: NEGATIVE mg/dL (ref 0.2–2.0)

## 2021-04-26 LAB — CBC
Absolute NRBC: 0 10*3/uL (ref 0.00–0.00)
Hematocrit: 33.8 % — ABNORMAL LOW (ref 34.7–43.7)
Hgb: 10.7 g/dL — ABNORMAL LOW (ref 11.4–14.8)
MCH: 24.2 pg — ABNORMAL LOW (ref 25.1–33.5)
MCHC: 31.7 g/dL (ref 31.5–35.8)
MCV: 76.5 fL — ABNORMAL LOW (ref 78.0–96.0)
MPV: 10.9 fL (ref 8.9–12.5)
Nucleated RBC: 0 /100 WBC (ref 0.0–0.0)
Platelets: 226 10*3/uL (ref 142–346)
RBC: 4.42 10*6/uL (ref 3.90–5.10)
RDW: 15 % (ref 11–15)
WBC: 5.71 10*3/uL (ref 3.10–9.50)

## 2021-04-26 LAB — LACTATE DEHYDROGENASE: LDH: 160 U/L (ref 120–331)

## 2021-04-26 LAB — GFR: EGFR: >60

## 2021-04-26 MED ORDER — ONDANSETRON HCL 4 MG/2ML IJ SOLN
4.0000 mg | Freq: Three times a day (TID) | INTRAMUSCULAR | Status: DC | PRN
Start: 2021-04-26 — End: 2021-04-26

## 2021-04-26 MED ORDER — SODIUM CHLORIDE 0.9 % IV SOLN
6.2500 mg | Freq: Four times a day (QID) | INTRAVENOUS | Status: DC | PRN
Start: 2021-04-26 — End: 2021-04-26

## 2021-04-26 MED ORDER — CALCIUM CARBONATE ANTACID 500 MG PO CHEW
1000.0000 mg | CHEWABLE_TABLET | Freq: Three times a day (TID) | ORAL | Status: DC | PRN
Start: 2021-04-26 — End: 2021-04-26

## 2021-04-26 MED ORDER — ACETAMINOPHEN 325 MG PO TABS
650.0000 mg | ORAL_TABLET | ORAL | Status: DC | PRN
Start: 2021-04-26 — End: 2021-04-26

## 2021-04-26 MED ORDER — ONDANSETRON 4 MG PO TBDP
4.0000 mg | ORAL_TABLET | Freq: Three times a day (TID) | ORAL | Status: DC | PRN
Start: 2021-04-26 — End: 2021-04-26

## 2021-04-26 MED ORDER — ALUM & MAG HYDROXIDE-SIMETH 200-200-20 MG/5ML PO SUSP
30.0000 mL | Freq: Four times a day (QID) | ORAL | Status: DC | PRN
Start: 2021-04-26 — End: 2021-04-26

## 2021-04-26 MED ORDER — PROMETHAZINE HCL 12.5 MG PO TABS
12.5000 mg | ORAL_TABLET | Freq: Four times a day (QID) | ORAL | Status: DC | PRN
Start: 2021-04-26 — End: 2021-04-26

## 2021-04-26 MED ORDER — PROMETHAZINE HCL 12.5 MG RE SUPP
12.5000 mg | Freq: Four times a day (QID) | RECTAL | Status: DC | PRN
Start: 2021-04-26 — End: 2021-04-26

## 2021-04-26 MED ORDER — SOD CITRATE-CITRIC ACID 500-334 MG/5ML PO SOLN
30.0000 mL | Freq: Once | ORAL | Status: DC | PRN
Start: 2021-04-26 — End: 2021-04-26

## 2021-04-26 MED ORDER — ACETAMINOPHEN 650 MG RE SUPP
650.0000 mg | RECTAL | Status: DC | PRN
Start: 2021-04-26 — End: 2021-04-26

## 2021-04-26 NOTE — Patient Instructions (Signed)
Things to consider and review before your delivery    By now your doctor may have spoken with you about different scenarios which may happen when you are in labor. It is always wise to think about different modes of delivery keeping in mind that our first choice for all women is an uncomplicated vaginal delivery.     While we will do everything in our power to help you have an uncomplicated vaginal delivery one of the reasons you have chosen to have your baby with physicians in a hospital is to prepare for any emergency which may happen. While in the vast majority of births things go smoothly occasionally intervention is needed to save the life of the mother or baby. Your physicians are well trained and experts in what to do when normal birth becomes complicated.     This information packet reviews some of the common scenarios and tools your doctors may use if needed.     Fetal Monitoring  During your labor you will have some monitors around your abdomen. These monitors provide information about your baby's heart rate and your contraction pattern. Normally you will have monitors on the outside of your belly to monitor what is happening inside. Two tools your doctor may decide to use are internal monitors called a fetal scalp electrode and an intrauterine pressure catheter.   Fetal Scalp Electrode: this is a small electrode which is placed on your baby's head to monitor the pulse more accurately. This is helpful if the external monitors are unable to monitor baby well.   Intrauterine Pressure Catheter: This is a small tube which is placed inside the uterus to measure the pressure of the contractions. This is helpful if the external monitors are unable to trace the contractions well or if your cervix is not changing with augmentation of labor.   Vacuum or Forceps Delivery: operative vaginal delivery is used for fetal distress to expedite delivery, when pushing is not recommended for the mother or when the mother  becomes exhausted. Your doctor will discuss with you the risks and benefits of an operative vaginal delivery.   Episiotomy: Routine episiotomy is NOT done in our practice. The only instance when an episiotomy is used is to help with a shoulder dystocia or in fetal distress.   Other Emergency Situations  Shoulder Dystocia: This is an unpredictable obstetric emergency where the fetal head delivers but the shoulders become stuck behind the pubic bone and the baby is unable to be born. During this emergency your doctor will use special maneuvers to deliver your baby and help remove the stuck shoulder.    Emergency Cesarean Section: If your baby is in distress and not responding to resuscitation your doctor may determine an emergency cesarean section is needed to deliver your baby quickly.        WHAT is induction of labor ?    Labor is the process that leads to the birth of a baby. Labor usually starts on its own. Labor induction is the use of medications or other methods to bring on (induce) labor. More than 20% of pregnant women in the [place] have labor induced. Labor may be induced for many reasons. Some medications used for induction or pre-induction also can be used to speed up labor that is going too slowly. Labor is induced to stimulate contractions of the uterus in an effort to have a vaginal birth. Labor induction may be recommended if the health of the mother or fetus is at   risk.    WHY is labor induced ? Some of the reasons for inducing labor include the following:    1) Maternal health problems: such as diabetes (gestational or pre-existing); high blood pressure (pre-existing, gestational or Pre-Eclampsia), thrombophilias (clotting disorders), morbid obesity, heart/kidney/lung conditions, etc...    2) Fetal problems: such as growth restriction, low amniotic fluid, non-reassuring fetal testing, marginal or velamentous cord insertion, 2-vessel cord, anomalies/birth defects.     3) Placental problems:  such as pacental abruption (the placenta begins to separate from the inner wall of the uterus before the baby is born)    4) Membrane problems: such as infection (chorioamnionitis), premature or preterm rupture of membranes    5) Uterine problems: such as fibroids, malformation of the uterus, etc     6) Post-dates: Pregnancy that lasts beyond estimated due date (equal to or more than 40 weeks).     It is our practice at [facility] in [place] that we induce in the 40th week of pregnancy (typically 40 1/2 weeks = 40 weeks 3 days) with goal of delivery before 41st week. This is recommended if spontaneous labor has not occurred by due date. Our recommendation is based on an increase in the risk for undesired outcomes in pregnancies lasting >40 1/2 weeks. These poor outcomes include: fetal death/stillbirth, higher risk of C-section, poor placental function, non-reassuring fetal testing, meconium, more difficult or prolonged labors. BEST outcomes for delivery occur in the 39th week which is full-term.    7) Fetal Death    8) Elective: In special situations, labor is induced for non-medical reasons, such as living far away from the hospital, having family available to care for you and baby, prior fast labors and several others. This is called an ELECTIVE induction. If elective labor induction is being considered, you should be aware that there are possible risks. Elective induction should not occur before 39 weeks of pregnancy!    WHEN is labor induced ?  Typically labor is induced at 39 weeks and beyond. However, some maternal and/or fetal complications during pregnancy necessitate that delivery occur at early term (37-38 weeks), late preterm (34-36 weeks), or even early preterm (<34 weeks). This may occur with conditions/complications in which the risks of continuing the pregnancy outweigh any risks associated with earlier delivery. Before labor is induced, your health care provider will review the baby's gestational  age, how your pregnancy is going, and the possible risks for you and the fetus. You and your obstetrician may also consult with a high-risk specialist (also known as a MFM - Maternal Fetal Medicine doctor or Perinatologist) to determine best timing and review indication for induction.    HOW is labor induced ?  There are several ways to start labor if it has not started naturally. The choice depends on several factors. These factors include your particular maternal or fetal health condition, cervical exam, the experience/preference of your physician and availability of medication/devices. Several of these methods may be used together.    Definition of Cervical Changes:  To prepare for labor and delivery, the cervix begins to soften, thin out, and open. These changes usually start a several weeks before labor begins especially in women who have had previous children. When you are pregnant with your FIRST baby, this can occur very late in pregnancy and sometimes not at all. This is called RIPENING and can be measured by a number of factors and given a score that can predict how successful induction of labor would be   without further medical ripening.     1) Effacement: The thinning out of the cervix. Before effacement, the cervix looks like a narrow tube about 4 centimeters long that is connected to the uterus. As the cervix becomes thinner, it shortens and pulls up toward the uterus. When effacement is complete, the cervix is part of the lower uterine wall. Effacement is measured in percentages, from 0% (no effacement) to 100% (full effacement).  2)  Dilation: The amount that the cervix has opened. Dilation is measured in centimeters, from 0 centimeters (no dilation) to 10 centimeters (fully dilated).  3) Station: How far in the pelvis the baby's head has descended. Measured from -4 to +4.   4) Consistency: The character of the cervix. The softening of the cervix allows it be able to stretch for labor. Described as  firm, medium/moderate or soft.   5) Position: Where the cervix is located in the vagina. Described as anterior / mid / posterior    Ripening the Cervix is the FIRST step:  Ripening the cervix is a process that helps the cervix soften and thin out in preparation for labor. Sometimes when labor is going to be induced, the cervix is not yet "ripe" "ready" or soft. Your health care provider will check to see if your cervix has started this change. Health care providers use the Bishop score to rate the readiness of the cervix for labor. With this scoring system, a number ranging from 0-13 is given to rate the condition of the cervix. A Bishop score of less than 6 means that your cervix may not be ready for induction of labor. In this case you will need to undergo "cervical ripening." Medications or devices may be used to soften/shorten/thin the cervix so it will stretch (dilate) for labor.    Prostaglandins are medications that can be used to ripen the cervix. They are forms of bio-chemicals produced naturally by the body. These medications can be inserted into the vagina or taken by mouth.   - Cervidil (small flattened tab attached to a string that is inserted far into vagina near cervix); usually stays in for ~12 hours   - Cytotec/Misoprostol (tiny piece of tablet) that is placed in the vagina near cervix or given orally to dissolve inside the cheek - MOST commonly used in our practice as 1st line along with the Cook's ripening balloon if possible.  - Prepidil (gel that is inserted in the vagina near the cervix) - NOT used at our hospital.  These medications are NOT used in women with a previous cesarean delivery or other prior uterine surgery (such as myomectomy / fibroid removal) to avoid increasing the possible risk of uterine rupture.    The cervix also can be widened with special dilators. A catheter (small tube) with inflatable balloon(s) on the end also can be inserted to open, soften, shorten and make the  cervix more flexible (Cook's ripening balloon or foley catheter). This can be used in conjunction with Cytotec/Misoprostol or even Oxytocin.     Cervical ripening can be a SLOW process and this is anticipated so it should not be discouraging. It can last from a few hours up to >36 hours (average is 12-18 hours); timing is largely dependent on whether you have had a baby previously. FIRST-time moms usually take longer.  We typically bring our patients in for cervical ripening in evening hours, with hopes of starting the induction process the following day. If the cervix is already "ripe/ready" then we   typically schedule admission for morning or early afternoon.    Induction Methods once the cervix is ripe:  1) Oxytocin:  Oxytocin is a hormone that causes contractions of the uterus; it is naturally produced by the body. It can be used to start labor or to speed up labor that began on its own. Oxytocin is given through an intravenous (IV) line. A pump hooked up to the IV line controls the amount given (this amount is tailored to the individual patient and will depend on a number of factors). Contractions usually start in about 30 minutes. Your condition, your contractions, and the baby's heart rate will be monitored when you are given this medication. This hormone is commonly referred to by the brand name of Pitocin.     2) Rupturing the Amniotic Sac: The amniotic sac also is called "the bag of water." If the sac has not broken already, and your labor has not started on its own, rupturing the amniotic sac can start contractions. It also can make them stronger if they have already begun. The health care provider makes a small hole in the amniotic sac with a special tool that looks like a plastic crochet hook. This procedure is called an amniotomy. This is ONLY done once dilation has already begun, the baby's head is in the pelvis and it is anticipated that you will delivery within 24 hours.     ARE there any risks to  being induced?  Sometimes problems can occur with both cervical ripening and labor induction just as they can with spontaneous or natural labor. With some methods, the uterus can be overstimulated, causing it to contract too frequently. Too many contractions may lead to changes in the fetal heart rate, umbilical cord problems, and placental abruption. Other risks of cervical ripening, labor induction and spontaneous/natural labor include the following:    Infection in uterus/membranes which can affect the mother or baby  Cesarean birth (there are many reasons for which this may be necessary)  Uterine rupture (VERY rare)  Fetal death (EXCEEDINGLY remote)    Medical problems that were present before pregnancy or occurred during pregnancy may contribute to these complications. To help prevent these complications, the fetal heart rate and contraction patterns will be electronically monitored during labor induction. You may also have internal monitoring with an intra-uterine pressure catheter or fetal scalp electrode if necessary to assess the strength of your contractions and fetal heart rate patterns better. You will also have an IV line with access to fluids as necessary.      Another risk of labor induction is that sometimes it does not work. A failed attempt at induction may mean that you will need to try another induction at a later date or have a cesarean delivery. The chance of having a cesarean delivery is higher for first-time mothers who have induction, especially if the cervix is not ready for labor (this is why cervical ripening is very important as REDUCES this risk). If cervical ripening is not effective; then there is a discussion about the next steps in care. Cesarean deliveries may pose additional risks for the mother and baby. However, a Cesarean delivery is only performed when it is in the BEST interest of the mother and baby. Babies born by cesarean delivery may have transient breathing problems.  Women who have cesarean deliveries may develop infections or have bleeding from the surgery. There may be complications for future pregnancies, such as problems with the placenta or the necessity to have   a repeat C-section. The recovery time for a cesarean delivery is usually longer than that for a vaginal delivery. Any questions regarding C-sections can be addressed with your doctor.     HOW can I avoid the need for induction of labor?  There may not be a way to avoid a medically-indicated induction of labor. However, there are a few things that you and your health provider can do to help you start labor naturally.     Stripping the Membranes:  "Stripping the membranes"  or "membrane sweep" is a common way to help the body start labor on its own. It can be done in your health care provider's office or in the hospital. You will need to be at least 1 cm dilated in order for this to be done. The health care provider sweeps a gloved finger over the thin membranes that connect the amniotic sac to the wall of the lower part of uterus and upper cervix. This action may cause your body to release Prostaglandins, which help to soften the cervix and may cause contractions. You will also experience bleeding following this exam and this is NORMAL.    Things You Can Do: sexual activity, being active (exercise, walking), nipple stimulation    Finally...  Labor induction is sometimes necessary to protect the health of both mother and baby. You and your health care provider will weigh the risks and benefits of labor induction compared with the risks and benefits of continuing the pregnancy. Understanding both the risks and benefits allows you and your health care provider to make the best choice for you and your baby!

## 2021-04-26 NOTE — Progress Notes (Addendum)
[facility] - OB [place]  OB TRIAGE NOTE     Date Time: 04/26/21 1:20 PM  Name: Heidi Espinoza (MRN: [id number], DOB: August 19, 1985)     HPI:   Patient is a 36 y.o. G37P0010 female at [redacted]w[redacted]d (Best EDD = Estimated Date of Delivery: 05/04/21) who presents to L&D for: r/o pih  She had severe range bp in the office. Not 4 hours apart.  But she also did have mild range bp over the weekend.   Patient attributes it to anxiety. She has not been checking bp regularly at home.   She reports   ROS:  +good FM  Denies RUCs, LOF, VB  Denies F/C, SOB/CP, N/V  Denies HA, visual changes, abdominal pain, excessive swelling    Pregnancy complicated by:   1.  Alpha thalassemia carrier - fob testing negative  2.  AMA at delivery  LDA until 36 wks  IGS 32 weeks (EFW 1826 grams, 35%), f/u prn  3.  + COVID, dx 10/23    Objective:   BP 133/81   Pulse (!) 103   Temp 99.1 F (37.3 C) (Oral)   Resp 18   Ht 1.715 m (5' 7.5")   Wt 93.9 kg (207 lb)   LMP 07/28/2020 (Exact Date)   SpO2 98%   BMI 31.94 kg/m   Patient Vitals for the past 24 hrs:   BP Temp Temp src Pulse Resp SpO2 Height Weight   04/26/21 1250 133/81 -- -- (!) 103 -- -- -- --   04/26/21 1235 135/77 -- -- 93 -- -- -- --   04/26/21 1220 127/73 -- -- 94 -- -- -- --   04/26/21 1205 130/77 -- -- 96 -- -- -- --   04/26/21 1134 131/78 99.1 F (37.3 C) Oral (!) 102 18 98 % 1.715 m (5' 7.5") 93.9 kg (207 lb)     General: A&O x3, cooperative, NAD  Abdomen: Gravid, soft, S=D  NST: Reactive (moderate variability, +accels 15x15, no decels, BL = 130)  FHR: Baseline Rate: 130 BPM, Variability: Moderate, Pattern: Accelerations,    Toco: Contraction Frequency: occasional        Labs:  Recent Labs   Lab 04/26/21  1142   WBC 5.71   Hgb 10.7*   Hematocrit 33.8*   Platelets 226     Recent Labs   Lab 04/26/21  1142   Sodium 143   Potassium 3.3*   Chloride 109   CO2 25   BUN 4.0*   Creatinine 0.6   EGFR >60.0   Glucose 88   Calcium 9.0     Recent Labs   Lab 04/26/21  1142   Bilirubin, Total  0.6   Protein, Total 5.9*   Albumin 2.8*   ALT 10   AST (SGOT) 15     P/Cr- too low to calculate      Results       Procedure Component Value Units Date/Time    GFR [[id number]] Collected: 04/26/21 1142     Updated: 04/26/21 1218     EGFR >60.0    Lactate dehydrogenase [[id number]] Collected: 04/26/21 1142    Specimen: Blood Updated: 04/26/21 1218     LDH 160 U/L     Uric acid [[id number]] Collected: 04/26/21 1142    Specimen: Blood Updated: 04/26/21 1218     Uric acid 3.9 mg/dL     Comprehensive metabolic panel [[id number]]  (Abnormal) Collected: 04/26/21 1142    Specimen: Blood Updated: 04/26/21 1218  Creatinine 0.6 mg/dL      AST (SGOT) 15 U/L      ALT 10 U/L      Glucose 88 mg/dL      BUN 4.0 mg/dL      Sodium 098 mEq/L      Potassium 3.3 mEq/L      Chloride 109 mEq/L      CO2 25 mEq/L      Calcium 9.0 mg/dL      Protein, Total 5.9 g/dL      Albumin 2.8 g/dL      Alkaline Phosphatase 131 U/L      Bilirubin, Total 0.6 mg/dL      Globulin 3.1 g/dL      Albumin/Globulin Ratio 0.9     Anion Gap 9.0    Protein / creatinine ratio, urine [[id number]] Collected: 04/26/21 1142    Specimen: Urine Updated: 04/26/21 1217     Urine Protein Random <6.8 mg/dL      Urine Creatinine, Random 18.2 mg/dL      Urine Protein/Creatinine Ratio see below    Urinalysis Reflex to Microscopic Exam- Reflex to Culture [[id number]] Collected: 04/26/21 1142     Updated: 04/26/21 1204     Urine Type Urine, Clean Ca     Color, UA Straw     Clarity, UA Clear     Specific Gravity UA 1.002     Urine pH 7.0     Leukocyte Esterase, UA Negative     Nitrite, UA Negative     Protein, UR Negative     Glucose, UA Negative     Ketones UA Negative     Urobilinogen, UA Negative mg/dL      Bilirubin, UA Negative     Blood, UA Negative    CBC without differential [[id number]]  (Abnormal) Collected: 04/26/21 1142    Specimen: Blood Updated: 04/26/21 1159     WBC 5.71 x10 3/uL      Hgb 10.7 g/dL      Hematocrit 28.4 %      Platelets 226 x10 3/uL      RBC 4.42 x10  6/uL      MCV 76.5 fL      MCH 24.2 pg      MCHC 31.7 g/dL      RDW 15 %      MPV 10.9 fL      Nucleated RBC 0.0 /100 WBC      Absolute NRBC 0.00 x10 3/uL             Assessment and Plan:    IUP @ [redacted]w[redacted]d with: gHTN  Here on L&D bp have been normal. Pih labs negative.   Fetal Well-being: reassuring   Reactive NST   Recommend iol now.  We discussed the risks vs. Benefits of iol   Risk include abruption, stroke, seizures, fetal death, and even maternal death. We discussed that having high bp would not always be symptomatic and these can occur suddenly w/o any warning.  Benefit of iol is that baby is full term and would not benefit from waiting any longer.  Patient declines iol at this time. States "she is not mentally prepared and needs to go home and take care of things".  Urged patient multiple times that she should stay and be delivered because benefit outweigh risks but patient and partner declines.  Total of 15 min spent on Informed decision making and having a lengthy discussion w/ patient   -iol scheduled on Friday 2/17  at 8AM.  - Discussed taking bp twice a day and calling if bp >140/90 and if any s/sx of preE.

## 2021-04-26 NOTE — Discharge Instr - AVS First Page (Addendum)
Reason for your Hospital Admission:  You were seen today for evaluation after elevated BP in the OBGYN office today.  We sent preeclampsia labs, obtained a reactive fetal non-stress test and collected serial blood pressures.  Your lab results were all normal.  Your BPs were all normal.        Instructions for after your discharge:  You are scheduled for Induction of labor on Friday, February 17 at 0800 hours.  Eat a good breakfast and arrive not later than 0800.    In the meantime, please take your blood pressure twice a day until then.  Remember to sit and relax for 10 minutes with your feet flat on the floor, focus on relaxation and then take your BP.    If in the time between now and Friday morning, you have sudden onset of a severe headache that is not resolved or at least better within an hour with tylenol, 1000mg  by mouth, visual changes, or upper abdominal pain that does not resolve, call your provider and come to the hospital.

## 2021-04-26 NOTE — Progress Notes (Signed)
Graycie Halley is a 36 y.o. female G2P0010 at [redacted]w[redacted]d. Estimated Date of Delivery: 05/04/21. Patient reports arranging house over weekend and developed severe HA, BP at that time was 150s/90s, took Tylenol and HA improved/resolved. She has not been checking BP's regularly at home. She reports good FM, denies visual changes or RUQ/epigastric pain. She does endorse significant level of anxiety. She is accompanied by her husband today.     Genetic Screening: Nipt neg XX;  +alpha thalassemia silent carrier (FOB negative); AFP negative  Anatomy US: WNL, f/u heart views WNL  Immunizations:  - Covid: moderna x2 and booster  - Flu: 10/5  - Tdap: 12/19     Pregnancy complicated by  1.  Alpha thalassemia carrier - fob testing negative  2.  AMA at delivery  LDA until 36 wks  IGS 32 weeks (EFW 1826 grams, 35%), f/u prn  3.  + COVID, dx 10/23  4.  Elevated BP's last visit (attributed to anxiety) and again today --> to L&D, see below.      Delivery Plan: IOL at 40 weeks, request submitted --> now if BP's persistently elevated.  GBS negative  Consents: signed 2/7    O: BP (!) 132/91   Pulse 79   Temp 97.3 F (36.3 C)   Wt 207 lb 3.2 oz (94 kg)   LMP 07/28/2020 (Exact Date)   BMI 31.50 kg/m . See flow sheet    Vitals:    04/26/21 0942 04/26/21 0945 04/26/21 1024 04/26/21 1026   BP: (!) 132/91 164/72 168/70 163/76   Pulse: 79 73 (!) 109 (!) 103   Temp: 97.3 F (36.3 C)      Weight: 207 lb 3.2 oz (94 kg)        Cervix: closed / thick / high / very posterior but soft    A/P: IUP @ [redacted]w[redacted]d  - Reviewed labor precautions and sx's of PIH  - Discussed PIH (GHTN and PEC), recommend work-up on L&D w/ likely IOL given elevated BP's.   - Discussed expectations of labor/delivery/IOL/ripening and hospital stay  - Notified charge RN and on-call MD  - Follow-up: prn and post-delivery    Idelle Leech, MD

## 2021-04-26 NOTE — Discharge Summary -  Nursing (Signed)
Patient discharged to home ambulatory.  Patient and her husband verbalized discharge teaching.

## 2021-04-26 NOTE — Progress Notes (Signed)
Primiparous patient arrived from [place] office for preeclampsia workup.  Patient denies headache, visual disturbances, epigastric pain, contractions, or leaking of fluid or blood.  She reports good fetal activity.

## 2021-04-26 NOTE — Discharge Instructions (Signed)
Understanding Preeclampsia  Preeclampsia is a condition that can happen in pregnancy. It includes high blood pressure (hypertension), swelling, and signs of organ problems. It can show up around week 20 of pregnancy. It often goes away by 12 weeks after you give birth. It can lead to serious health risks for you and your baby. During your pregnancy, your healthcare provider will watch your blood pressure.     Your blood pressure will be monitored regularly throughout your pregnancy to help check for preeclampsia.     Dangers of preeclampsia   If not treated, preeclampsia can cause problems for you and your baby. The placenta is the organ that nourishes your baby. It may tear away from the wall of the uterus. This can put the baby at risk for health problems (fetal distress). It can put the baby at risk for preterm birth. Preeclampsia can also cause these health problems in you:  Kidney failure or other organ damage  Seizures  Stroke  Who's at risk for preeclampsia?   No one knows what causes preeclampsia. It can happen in any pregnant person. But there are things that increase your risk. You may need to take a daily low dose of aspirin if you are at risk for preeclampsia.  You're at higher risk for preeclampsia if you have any of these:  Diabetes  High blood pressure  Obesity  Kidney disease  Autoimmune disease such as lupus  A family history of preeclampsia  You're at higher risk if any of these apply to you:  This is your first pregnancy  You are having twins or more  You're under age 18 or over age 40  You used in vitro fertilization  You are Black  And you're at higher risk if you had any of these in a past pregnancy:  Preeclampsia  Intrauterine growth restriction (IUGR)  Preterm birth  Placental abruption  Fetal death  Symptoms  A common symptom of preeclampsia is high blood pressure. Other symptoms may include:  Fast weight gain  Protein in your urine  Headache  Belly (abdominal) pain on your right  side  Vision problems such as flashes or spots  Swelling (edema) in your face or hands (this often happens near the end of a normal pregnancy)  Tests you may have  Your healthcare provider will want to check your blood pressure. This will need to be done often in your pregnancy. If your blood pressure is high, you may have these tests:  Urine tests to look for protein  Blood tests to confirm preeclampsia  Fetal monitoring to make sure that your baby is healthy  Treating preeclampsia  Preeclampsia almost always ends soon after you give birth. Until then, your healthcare provider can help you manage it.  If your symptoms are mild, you may to:     Limit your activity  Rest in bed  Not do heavy lifting     If your symptoms are severe, you will stay in the hospital. Treatment here may include:  Limits to your activity. This is to help control your blood pressure. You should not lift anything heavy. You will need to spend 8 hours a day lying down with your feet up.  Magnesium IV (intravenous) drip. This is done during labor. It's to prevent seizures  Induced labor or cesarean section.  When to call your healthcare provider  Call your healthcare provider if your symptoms start quickly or are severe. This includes swelling, weight gain, or other symptoms. Some   severe. This includes swelling, weight gain, or other symptoms. Some cases of preeclampsia are more severe than others. Your symptoms also may change or get worse as you get closer to your due date.   Once you give birth   In most cases, preeclampsia goes away on its own soon after you give birth. This is often by the 12th week after you deliver. Within days after you give birth, your blood pressure, swelling, and other symptoms should get better. But for some people, problems from preeclampsia can continue after birth.   Postpartum preeclampsia   Preeclampsia that starts after birth is rare. There are 2 types:   Postpartum preeclampsia. This may start in the first 48 hours after birth.  Late-onset preeclampsia. This starts more  than 48 hours after birth.  Both of these types are rare. But call your healthcare provider right away if you have symptoms of preeclampsia after you give birth.   How daily issues affect your health  Many things in your daily life impact your health. This can include transportation, money problems, housing, access to food, and child care. If you can't get to medical appointments, you may not receive the care you need. When money is tight, it may be difficult to pay for medicines. And living far from a grocery store can make it hard to buy healthy food.   If you have concerns in any of these or other areas, talk with your healthcare team. They may know of local resources to assist you. Or they may have a staff person who can help.   [employer] last reviewed this educational content on 12/11/2019   2000-2022 [employer]. All rights reserved. This information is not intended as a substitute for professional medical care. Always follow your healthcare professional's instructions.

## 2021-04-28 ENCOUNTER — Encounter: Payer: Self-pay | Admitting: Anesthesiology

## 2021-04-28 ENCOUNTER — Observation Stay

## 2021-04-28 ENCOUNTER — Inpatient Hospital Stay
Admission: AD | Admit: 2021-04-28 | Discharge: 2021-05-03 | DRG: 807 | Disposition: A | Discharge status: Home / Self Care | Attending: Obstetrics & Gynecology | Admitting: Obstetrics & Gynecology

## 2021-04-28 ENCOUNTER — Encounter: Payer: Self-pay | Admitting: Obstetrics and Gynecology

## 2021-04-28 DIAGNOSIS — O99344 Other mental disorders complicating childbirth: Secondary | ICD-10-CM | POA: Diagnosis present

## 2021-04-28 DIAGNOSIS — O0993 Supervision of high risk pregnancy, unspecified, third trimester: Secondary | ICD-10-CM

## 2021-04-28 DIAGNOSIS — O09529 Supervision of elderly multigravida, unspecified trimester: Secondary | ICD-10-CM

## 2021-04-28 DIAGNOSIS — F411 Generalized anxiety disorder: Secondary | ICD-10-CM | POA: Diagnosis present

## 2021-04-28 DIAGNOSIS — Z8616 Personal history of COVID-19: Secondary | ICD-10-CM

## 2021-04-28 DIAGNOSIS — O134 Gestational [pregnancy-induced] hypertension without significant proteinuria, complicating childbirth: Principal | ICD-10-CM | POA: Diagnosis present

## 2021-04-28 DIAGNOSIS — Z3A39 39 weeks gestation of pregnancy: Secondary | ICD-10-CM

## 2021-04-28 DIAGNOSIS — O139 Gestational [pregnancy-induced] hypertension without significant proteinuria, unspecified trimester: Secondary | ICD-10-CM | POA: Diagnosis present

## 2021-04-28 DIAGNOSIS — D563 Thalassemia minor: Secondary | ICD-10-CM | POA: Diagnosis present

## 2021-04-28 DIAGNOSIS — O9902 Anemia complicating childbirth: Secondary | ICD-10-CM | POA: Diagnosis present

## 2021-04-28 LAB — CBC AND DIFFERENTIAL
Absolute NRBC: 0 10*3/uL (ref 0.00–0.00)
Basophils Absolute Automated: 0.01 10*3/uL (ref 0.00–0.08)
Basophils Automated: 0.2 %
Eosinophils Absolute Automated: 0.03 10*3/uL (ref 0.00–0.44)
Eosinophils Automated: 0.6 %
Hematocrit: 35.1 % (ref 34.7–43.7)
Hgb: 11.2 g/dL — ABNORMAL LOW (ref 11.4–14.8)
Immature Granulocytes Absolute: 0.02 10*3/uL (ref 0.00–0.07)
Immature Granulocytes: 0.4 %
Instrument Absolute Neutrophil Count: 3.7 10*3/uL (ref 1.10–6.33)
Lymphocytes Absolute Automated: 1.24 10*3/uL (ref 0.42–3.22)
Lymphocytes Automated: 22.8 %
MCH: 24.7 pg — ABNORMAL LOW (ref 25.1–33.5)
MCHC: 31.9 g/dL (ref 31.5–35.8)
MCV: 77.3 fL — ABNORMAL LOW (ref 78.0–96.0)
MPV: 10.8 fL (ref 8.9–12.5)
Monocytes Absolute Automated: 0.44 10*3/uL (ref 0.21–0.85)
Monocytes: 8.1 %
Neutrophils Absolute: 3.7 10*3/uL (ref 1.10–6.33)
Neutrophils: 67.9 %
Nucleated RBC: 0 /100 WBC (ref 0.0–0.0)
Platelets: 241 10*3/uL (ref 142–346)
RBC: 4.54 10*6/uL (ref 3.90–5.10)
RDW: 15 % (ref 11–15)
WBC: 5.44 10*3/uL (ref 3.10–9.50)

## 2021-04-28 LAB — COMPREHENSIVE METABOLIC PANEL
ALT: 12 U/L (ref 0–55)
AST (SGOT): 24 U/L (ref 5–41)
Albumin/Globulin Ratio: 0.8 — ABNORMAL LOW (ref 0.9–2.2)
Albumin: 2.8 g/dL — ABNORMAL LOW (ref 3.5–5.0)
Alkaline Phosphatase: 149 U/L — ABNORMAL HIGH (ref 37–117)
Anion Gap: 13 (ref 5.0–15.0)
BUN: 5 mg/dL — ABNORMAL LOW (ref 7.0–21.0)
Bilirubin, Total: 0.8 mg/dL (ref 0.2–1.2)
CO2: 23 mEq/L (ref 17–29)
Calcium: 9.3 mg/dL (ref 8.5–10.5)
Chloride: 106 mEq/L (ref 99–111)
Creatinine: 0.6 mg/dL (ref 0.4–1.0)
Globulin: 3.3 g/dL (ref 2.0–3.6)
Glucose: 75 mg/dL (ref 70–100)
Potassium: 3.2 mEq/L — ABNORMAL LOW (ref 3.5–5.3)
Protein, Total: 6.1 g/dL (ref 6.0–8.3)
Sodium: 142 mEq/L (ref 135–145)

## 2021-04-28 LAB — GFR: EGFR: >60

## 2021-04-28 LAB — TYPE AND SCREEN
AB Screen Gel: NEGATIVE
ABO Rh: O POS

## 2021-04-28 MED ORDER — LACTATED RINGERS IV SOLN
INTRAVENOUS | Status: DC
Start: 2021-04-28 — End: 2021-05-02

## 2021-04-28 MED ORDER — TRANEXAMIC ACID-NACL 1000-0.7 MG/100ML-% IV SOLN
1000.0000 mg | Freq: Once | INTRAVENOUS | Status: AC | PRN
Start: 2021-04-28 — End: 2021-05-02

## 2021-04-28 MED ORDER — ONDANSETRON 4 MG PO TBDP
4.0000 mg | ORAL_TABLET | Freq: Three times a day (TID) | ORAL | Status: DC | PRN
Start: 2021-04-28 — End: 2021-05-02

## 2021-04-28 MED ORDER — TERBUTALINE SULFATE 1 MG/ML IJ SOLN
0.2500 mg | Freq: Once | INTRAMUSCULAR | Status: DC | PRN
Start: 2021-04-28 — End: 2021-05-02
  Filled 2021-04-28: qty 1

## 2021-04-28 MED ORDER — ONDANSETRON HCL 4 MG/2ML IJ SOLN
4.0000 mg | Freq: Three times a day (TID) | INTRAMUSCULAR | Status: DC | PRN
Start: 2021-04-28 — End: 2021-05-02

## 2021-04-28 MED ORDER — OXYTOCIN 10 UNIT/ML IJ SOLN
10.0000 [IU] | Freq: Once | INTRAMUSCULAR | Status: DC | PRN
Start: 2021-04-28 — End: 2021-05-02

## 2021-04-28 MED ORDER — MISOPROSTOL 25 MCG PO SPLIT TAB
50.0000 ug | ORAL_TABLET | Freq: Four times a day (QID) | ORAL | Status: DC | PRN
Start: 2021-04-28 — End: 2021-05-01
  Administered 2021-04-28 – 2021-04-29 (×5): 50 ug via ORAL
  Filled 2021-04-28 (×5): qty 2

## 2021-04-28 MED ORDER — PROMETHAZINE HCL 12.5 MG RE SUPP
12.5000 mg | Freq: Four times a day (QID) | RECTAL | Status: DC | PRN
Start: 2021-04-28 — End: 2021-05-02

## 2021-04-28 MED ORDER — ALUM & MAG HYDROXIDE-SIMETH 200-200-20 MG/5ML PO SUSP
30.0000 mL | Freq: Four times a day (QID) | ORAL | Status: DC | PRN
Start: 2021-04-28 — End: 2021-05-02

## 2021-04-28 MED ORDER — MISOPROSTOL 200 MCG PO TABS
800.0000 ug | ORAL_TABLET | Freq: Once | ORAL | Status: AC | PRN
Start: 2021-04-28 — End: 2021-05-02

## 2021-04-28 MED ORDER — ACETAMINOPHEN 650 MG RE SUPP
650.0000 mg | RECTAL | Status: DC | PRN
Start: 2021-04-28 — End: 2021-05-02

## 2021-04-28 MED ORDER — SOD CITRATE-CITRIC ACID 500-334 MG/5ML PO SOLN
30.0000 mL | Freq: Once | ORAL | Status: DC | PRN
Start: 2021-04-28 — End: 2021-05-02

## 2021-04-28 MED ORDER — SODIUM CHLORIDE 0.9 % IV SOLN
500.0000 mg | Freq: Once | INTRAVENOUS | Status: DC | PRN
Start: 2021-04-28 — End: 2021-05-02

## 2021-04-28 MED ORDER — PROMETHAZINE HCL 12.5 MG PO TABS
12.5000 mg | ORAL_TABLET | Freq: Four times a day (QID) | ORAL | Status: DC | PRN
Start: 2021-04-28 — End: 2021-05-02

## 2021-04-28 MED ORDER — OXYTOCIN-SODIUM CHLORIDE 30-0.9 UT/500ML-% IV SOLN
7.5000 [IU]/h | INTRAVENOUS | Status: DC | PRN
Start: 2021-04-28 — End: 2021-05-01
  Filled 2021-04-28: qty 500

## 2021-04-28 MED ORDER — ACETAMINOPHEN 325 MG PO TABS
650.0000 mg | ORAL_TABLET | ORAL | Status: DC | PRN
Start: 2021-04-28 — End: 2021-05-02

## 2021-04-28 MED ORDER — LIDOCAINE HCL (PF) 1 % IJ SOLN
10.0000 mL | INTRAMUSCULAR | Status: DC
Start: 2021-04-28 — End: 2021-05-02

## 2021-04-28 MED ORDER — CARBOPROST TROMETHAMINE 250 MCG/ML IM SOLN
250.0000 ug | Freq: Once | INTRAMUSCULAR | Status: DC | PRN
Start: 2021-04-28 — End: 2021-05-02

## 2021-04-28 MED ORDER — SODIUM CHLORIDE 0.9 % IV SOLN
6.2500 mg | Freq: Four times a day (QID) | INTRAVENOUS | Status: DC | PRN
Start: 2021-04-28 — End: 2021-05-02

## 2021-04-28 MED ORDER — METHYLERGONOVINE MALEATE 0.2 MG/ML IJ SOLN
200.0000 ug | INTRAMUSCULAR | Status: DC | PRN
Start: 2021-04-28 — End: 2021-05-02

## 2021-04-28 MED ORDER — NALOXONE HCL 0.4 MG/ML IJ SOLN (WRAP)
0.2000 mg | INTRAMUSCULAR | Status: DC | PRN
Start: 2021-04-28 — End: 2021-05-02

## 2021-04-28 MED ORDER — CALCIUM CARBONATE ANTACID 500 MG PO CHEW
1000.0000 mg | CHEWABLE_TABLET | Freq: Three times a day (TID) | ORAL | Status: DC | PRN
Start: 2021-04-28 — End: 2021-05-02

## 2021-04-28 NOTE — Plan of Care (Signed)
Problem: Vaginal/Cesarean Delivery  Goal: Maternal Status within defined parameters  Outcome: Progressing  Goal: Evidence of Fetal Well Being  Outcome: Progressing  Goal: Free from Maternal/Fetal Infection  Outcome: Progressing  Goal: Intrapartum management of pain/discomfort  Outcome: Progressing  Goal: VTE Prevention  Outcome: Progressing

## 2021-04-28 NOTE — H&P (Signed)
[facility]: OB Admission History and Physical      Patient ID: Ceci Taliaferro, MRN: [id number], DOB: 03/26/1985   Date: 04/28/2021 10:48 AM    Chief Complaint: scheduled IOL    HPI:  Sakura Denis is a 36 y.o. G2P0010 at [redacted]w[redacted]d gestation with Estimated Date of Delivery: 05/04/21 who presents to L&D for IOL due to Gestational hypertension. She reports good FM; denies LOF, VB or regular UCs. She is accompanied by:     Prenatal complications/issues:   1.  Alpha thalassemia carrier - fob testing negative  2.  AMA at delivery  LDA until 36 wks  IGS 32 weeks (EFW 1826 grams, 35%), f/u prn  3.  + COVID, dx 10/23  OB Hx:   OB History   Gravida Para Term Preterm AB Living   2 0 0 0 1 0   SAB IAB Ectopic Multiple Live Births     1 0 0 0      # Outcome Date GA Lbr Len/2nd Weight Sex Delivery Anes PTL Lv   2 Current            1 IAB              PMH:   Past Medical History:   Diagnosis Date    Anxiety     Disorder of thyroid     Hypertension      PSH:   Past Surgical History:   Procedure Laterality Date    THYROID CYST EXCISION Right     in 3rd grade, path = benign    WISDOM TOOTH EXTRACTION Bilateral 09/2015     FH:   Family History   Problem Relation Age of Onset    Stroke Mother     Hyperlipidemia Mother     Hypertension Mother     Bell's palsy Mother     Hyperlipidemia Father     Diabetes Father     Sarcoidosis Father      SH:   Social History     Socioeconomic History    Marital status: Married   Tobacco Use    Smoking status: Never    Smokeless tobacco: Never   Vaping Use    Vaping Use: Never used   Substance and Sexual Activity    Alcohol use: Not Currently     Comment: socially; not during pregnancy    Drug use: Never    Sexual activity: Yes     Partners: Male     Birth control/protection: None     Meds:   Medications Prior to Admission   Medication Sig Dispense Refill Last Dose    acetaminophen (TYLENOL) 325 MG tablet Take 325 mg by mouth   Past Week    calcium carbonate (TUMS) 500 MG chewable tablet Chew 1 tablet by  mouth daily   Past Week    Prenatal MV & Min w/FA-DHA (Prenatal Adult Gummy/DHA/FA) 0.4-25 MG Chew Tab Chew by mouth   04/27/2021    Misc. Devices (Breast Pump) Misc Dispense 1 double electric breast pump for breastfeeding mother for use for 12 months. 1 each 0 Unknown     Allergies:    Allergies   Allergen Reactions    Penicillins Hives, Other (See Comments) and Rash     Pt unsure; reaction as child       Other Environmental      seasonal     ROS: as in HPI, o/w negative.    Objective:  BP 128/79   Pulse  97   Temp 97.4 F (36.3 C) (Oral)   Resp 18   Ht 1.715 m (5' 7.5")   Wt 94.2 kg (207 lb 9.6 oz)   LMP 07/28/2020 (Exact Date)   BMI 32.03 kg/m   Gen: NAD, A&O x 3, cooperative  Lungs: unlabored breathing, no use of accessory muscles  Heart: regular rate  Abd: Soft, gravid, non-tender, S = D    FHR: Baseline Rate: 135 BPM, Variability: Moderate, Pattern: Accelerations, FHR Category: Category I  Ctx's:    Cervix: Dilation: .5 Effacement (%): 40 Station: -3 Membrane Status: Intact    Bishop Score: 2    Labs: Prenatal labs reviewed: blood type = O positive, GBS: negative, remainder of labs in Epic.  Recent Labs   Lab 04/28/21  0930   WBC 5.44   Hgb 11.2*   Hematocrit 35.1   Platelets 241          Assessment:    35 y.o. G2P0010 at [redacted]w[redacted]d   GBS: negative.   Fetal Well-being: Category 1  Indication for WNU:UVOZDGUYQIH hypertension   Bedside ultrasound cephalic ROT  Pregnancy complicated by:   1.  Alpha thalassemia carrier - fob testing negative  2.  AMA at delivery  LDA until 36 wks  IGS 32 weeks (EFW 1826 grams, 35%), f/u prn  3.  + COVID, dx 10/23    Plan:  - Admit to L&D for IOL: planning P.O. cytotec 50, pitocin. Will reassess at appropriate intervals for further intervention  - CEFM/tocometry, routine labs, IVF  - Pain management reviewed: IV pain meds prn, epidural if/when desired  - Discussed R/B/A and expectations of induction +/- cervical ripening, labor, delivery and recovery.  - POC reviewed, questions  addressed and consents signed    Emilee Hero, MD, [facility]

## 2021-04-28 NOTE — Progress Notes (Signed)
Report given to [facility]. Care relinquished at this time.

## 2021-04-28 NOTE — Plan of Care (Signed)
Problem: Vaginal/Cesarean Delivery  Goal: Maternal Status within defined parameters  Outcome: Progressing  Goal: Evidence of Fetal Well Being  Outcome: Progressing  Goal: Free from Maternal/Fetal Infection  Outcome: Progressing  Goal: Intrapartum management of pain/discomfort  Outcome: Progressing  Goal: Breasts are soft with nipple integrity intact  Outcome: Progressing  Goal: Gastrointestinal/Urinary management  Outcome: Progressing  Goal: Uterine management  Outcome: Progressing  Goal: Perineum will be clean, dry, and intact and without discharge or hematoma  Outcome: Progressing  Goal: VTE Prevention  Outcome: Progressing  Goal: Evidence of positive mother-baby interactions  Outcome: Progressing

## 2021-04-28 NOTE — Anesthesia Preprocedure Evaluation (Deleted)
Anesthesia Evaluation    AIRWAY    Mallampati: III    TM distance: >3 FB  Neck ROM: full  Mouth Opening:full  Planned to use difficult airway equipment: No CARDIOVASCULAR    cardiovascular exam normal       DENTAL    no notable dental hx               PULMONARY    pulmonary exam normal     OTHER FINDINGS                                      Relevant Problems   CARDIO   (+) Gestational hypertension affecting first pregnancy   (+) Hypertension affecting pregnancy in third trimester               Anesthesia Plan    ASA 2     epidural                     Detailed anesthesia plan: epidural        Post op pain management: PO analgesics    informed consent obtained    ECG reviewed  pertinent labs reviewed  imaging results reviewed           Signed by: Odette Fraction, MD 04/28/21 9:42 PM

## 2021-04-29 ENCOUNTER — Encounter: Payer: Self-pay | Admitting: Obstetrics and Gynecology

## 2021-04-29 MED ORDER — SOD CITRATE-CITRIC ACID 500-334 MG/5ML PO SOLN
30.0000 mL | Freq: Once | ORAL | Status: DC | PRN
Start: 2021-04-29 — End: 2021-05-01

## 2021-04-29 MED ORDER — LACTATED RINGERS IV BOLUS
1000.0000 mL | Freq: Once | INTRAVENOUS | Status: AC
Start: 2021-04-29 — End: 2021-04-30
  Administered 2021-04-30: 1000 mL via INTRAVENOUS

## 2021-04-29 MED ORDER — NALOXONE HCL 0.4 MG/ML IJ SOLN (WRAP)
0.1000 mg | INTRAMUSCULAR | Status: DC | PRN
Start: 2021-04-29 — End: 2021-05-02

## 2021-04-29 MED ORDER — FAMOTIDINE 10 MG/ML IV SOLN (WRAP)
20.0000 mg | Freq: Once | INTRAVENOUS | Status: DC | PRN
Start: 2021-04-29 — End: 2021-05-02

## 2021-04-29 MED ORDER — EPHEDRINE SULFATE 50 MG/ML IJ/IV SOLN (WRAP)
10.0000 mg | Freq: Once | Status: DC | PRN
Start: 2021-04-29 — End: 2021-05-02
  Filled 2021-04-29: qty 1

## 2021-04-29 MED ORDER — FENTANYL-BUPIVACAINE-NACL 0.2-0.125-0.9 MG/100ML-% EP SOLN
10.0000 mL/h | EPIDURAL | Status: DC
Start: 2021-04-29 — End: 2021-05-02
  Administered 2021-04-30 – 2021-05-02 (×5): 10 mL/h via EPIDURAL
  Filled 2021-04-29 (×5): qty 100

## 2021-04-29 MED ORDER — DINOPROSTONE 10 MG VA INST
10.0000 mg | VAGINAL_INSERT | Freq: Once | VAGINAL | Status: AC
Start: 2021-04-29 — End: 2021-04-29
  Administered 2021-04-29: 10 mg via VAGINAL
  Filled 2021-04-29: qty 1

## 2021-04-29 NOTE — Progress Notes (Signed)
Uterine contractions spaced out; 3rd dose of cytotec given.

## 2021-04-29 NOTE — Progress Notes (Signed)
[facility] OB Progress Note         Date Time: 04/29/21 9:43 AM  Patient Name: Heidi Espinoza, Heidi Espinoza      Subjective:   Patient had cramps overnight.   Objective:   BP 118/70   Pulse 77   Temp 97.9 F (36.6 C) (Oral)   Resp 16   Ht 1.715 m (5' 7.5")   Wt 94.2 kg (207 lb 9.6 oz)   LMP 07/28/2020 (Exact Date)   SpO2 99%   BMI 32.03 kg/m     Cervix: 1/50/-3/post, cephalic  Membranes: intact  FHT: 130/moderate variability/+accels/no decels  Contractions: Q3-4 mild  Categories: Category I    Medications: Cytotec 50 mcg PO     Assessment:   36 y.o. G2P0010 at [redacted]w[redacted]d   -attempter cook catheter placement unsuccessful   GBS: negative.   Fetal Well-being: Category 1  Indication for ZHY:QMVHQIONGEX hypertension   Bedside ultrasound cephalic ROT  Pregnancy complicated by:   1.  Alpha thalassemia carrier - fob testing negative  2.  AMA at delivery  LDA until 36 wks  IGS 32 weeks (EFW 1826 grams, 35%), f/u prn  3.  + COVID, dx 10/23    Plan:   Continuous fetal monitoring   Continue Cytotec   Reassess prn    Signed by: Emilee Hero, MD 04/29/2021

## 2021-04-29 NOTE — Plan of Care (Signed)
Patient resting comfortably. States she is not feeling her contractions. Vitals and assessment stable. FHR reactive. Plan of care reviewed with patient. Patient agreeable with plan.

## 2021-04-29 NOTE — Progress Notes (Signed)
Patient is contracting and feels them; 3rd dose of cytotec not given.

## 2021-04-29 NOTE — Plan of Care (Signed)
Problem: Vaginal/Cesarean Delivery  Goal: Maternal Status within defined parameters  Outcome: Progressing  Goal: Evidence of Fetal Well Being  Outcome: Progressing  Goal: Free from Maternal/Fetal Infection  Outcome: Progressing  Goal: Intrapartum management of pain/discomfort  Outcome: Progressing  Goal: Breasts are soft with nipple integrity intact  Outcome: Progressing  Goal: Gastrointestinal/Urinary management  Outcome: Progressing  Goal: Uterine management  Outcome: Progressing  Goal: Perineum will be clean, dry, and intact and without discharge or hematoma  Outcome: Progressing  Goal: VTE Prevention  Outcome: Progressing  Goal: Evidence of positive mother-baby interactions  Outcome: Progressing  Goal: Postpartum management of pain/discomfort  Outcome: Progressing

## 2021-04-29 NOTE — Nursing Progress Note (Signed)
Pt off EFM to shower. Will reapply after pt is done. Cat 1 strip.

## 2021-04-29 NOTE — Progress Notes (Signed)
Patient's care handed over to Kathreen Cosier, Charity fundraiser.

## 2021-04-29 NOTE — Progress Notes (Signed)
FHR 130/moderate variability/+accels/no decels   Ctx's toco Q3-4 min    Emilee Hero, MD

## 2021-04-30 ENCOUNTER — Encounter: Payer: Self-pay | Admitting: Anesthesiology

## 2021-04-30 ENCOUNTER — Encounter: Payer: Self-pay | Admitting: Obstetrics and Gynecology

## 2021-04-30 MED ORDER — BUPIVACAINE HCL (PF) 0.25 % IJ SOLN
INTRAMUSCULAR | Status: DC | PRN
Start: 2021-04-30 — End: 2021-05-02
  Administered 2021-04-30: 6 mL via EPIDURAL

## 2021-04-30 MED ORDER — FENTANYL CITRATE (PF) 50 MCG/ML IJ SOLN (WRAP)
100.0000 ug | INTRAMUSCULAR | Status: DC | PRN
Start: 2021-04-30 — End: 2021-05-02
  Administered 2021-04-30 (×2): 100 ug via INTRAVENOUS
  Filled 2021-04-30 (×3): qty 2

## 2021-04-30 MED ORDER — OXYTOCIN-SODIUM CHLORIDE 30-0.9 UT/500ML-% IV SOLN
7.5000 [IU]/h | INTRAVENOUS | Status: DC | PRN
Start: 2021-05-01 — End: 2021-05-01

## 2021-04-30 MED ORDER — OXYTOCIN-SODIUM CHLORIDE 30-0.9 UT/500ML-% IV SOLN
2.0000 m[IU]/min | INTRAVENOUS | Status: DC | PRN
Start: 2021-04-30 — End: 2021-05-01
  Administered 2021-04-30 – 2021-05-01 (×2): 2 m[IU]/min via INTRAVENOUS

## 2021-04-30 MED ORDER — LIDOCAINE-EPINEPHRINE 1.5 %-1:200000 IJ SOLN
INTRAMUSCULAR | Status: DC | PRN
Start: 2021-04-30 — End: 2021-05-02
  Administered 2021-04-30 – 2021-05-02 (×3): 5 mL via EPIDURAL

## 2021-04-30 NOTE — Anesthesia Preprocedure Evaluation (Signed)
Anesthesia Evaluation    AIRWAY    Mallampati: III    TM distance: >3 FB  Neck ROM: full  Mouth Opening:full  Planned to use difficult airway equipment: No CARDIOVASCULAR    cardiovascular exam normal       DENTAL    no notable dental hx               PULMONARY    pulmonary exam normal     OTHER FINDINGS                                      Relevant Problems   CARDIO   (+) Gestational hypertension affecting first pregnancy   (+) Hypertension affecting pregnancy in third trimester               Anesthesia Plan    ASA 2     epidural                     Detailed anesthesia plan: epidural        Post op pain management: PO analgesics    informed consent obtained    ECG reviewed  pertinent labs reviewed  imaging results reviewed           Signed by: Odette Fraction, MD 04/30/21 8:46 PM

## 2021-04-30 NOTE — Progress Notes (Signed)
[facility] OB Progress Note         Date Time: 04/30/21 9:57 AM  Patient Name: RAENELL, MENSING      Subjective:   Patient had increased cramping overnight and required IV pain medication. Currently mild cramping    Objective:   BP 118/74   Pulse 81   Temp 98.3 F (36.8 C) (Oral)   Resp 17   Ht 1.715 m (5' 7.5")   Wt 94.2 kg (207 lb 9.6 oz)   LMP 07/28/2020 (Exact Date)   SpO2 98%   BMI 32.03 kg/m     Cervix: 1-2/60/-3/mid/soft, cephalic  Membranes: intact  FHT: 125/moderate variability/+accels/ no decels   Contractions:   Categories: Category I    Medications: Cytotec 50 mcg PO and Cervadil     Assessment:   36 y.o. G2P0010 at [redacted]w[redacted]d   -attempted cook catheter placement unsuccessful yesterday   -cervidil removed this AM  -s/p Cytotec 50 mcg buccal x 5   GBS: negative.   Fetal Well-being: Category 1  Indication for GNF:AOZHYQMVHQI hypertension   Bedside ultrasound cephalic ROT  Pregnancy complicated by:   1.  Alpha thalassemia carrier - fob testing negative  2.  AMA at delivery  LDA until 36 wks  IGS 32 weeks (EFW 1826 grams, 35%), f/u prn  3.  + COVID, dx 10/23    Plan:   Continuous fetal monitoring   Start pitocin per protocol   Reassess prn    Signed by: Emilee Hero, MD 04/30/2021

## 2021-04-30 NOTE — Progress Notes (Signed)
Report given to [facility] RN/[place] RN. Care relinquished at this time.

## 2021-04-30 NOTE — Progress Notes (Signed)
[facility] OB Progress Note         Date Time: 04/30/21 9:54 PM  Patient Name: Heidi Espinoza, Heidi Espinoza      Subjective:   Patient comfortable with epidural     Objective:   BP 119/66   Pulse 84   Temp 97.9 F (36.6 C) (Oral)   Resp 18   Ht 1.715 m (5' 7.5")   Wt 94.2 kg (207 lb 9.6 oz)   LMP 07/28/2020 (Exact Date)   SpO2 98%   BMI 32.03 kg/m     Cervix: 2/50/-3, cephalic cook catheter placed uterine balloon 80 mL ; vaginal balloon 50 mL   Membranes: intact  FHT: 130/moderate variability/+accels/few late decels after epidural now resolved   Contractions:   Categories: Category I    Medications: Cytotec 50 mcg PO, Pitocin, and Cervadil     Assessment:   36 y.o. G2P0010 at [redacted]w[redacted]d   -attempted cook catheter placement unsuccessful yesterday   -s/p Cytotec 50 mcg buccal x 5 ; s/p cervidil ; on pitocin and cook catheter in place  GBS: negative.   Fetal Well-being: Category 1  Indication for ZOX:[id number] hypertension   Bedside ultrasound cephalic ROT  Pregnancy complicated by:   1.  Alpha thalassemia carrier - fob testing negative  2.  AMA at delivery  LDA until 36 wks  IGS 32 weeks (EFW 1826 grams, 35%), f/u prn  3.  + COVID, dx 10/23    Plan:   Continuous fetal monitoring   Low dose pitocin; increase per protocol   Cook catheter in place   Reassess prn    Emilee Hero, MD, FACOG     Signed by: Emilee Hero, MD 04/30/2021

## 2021-04-30 NOTE — Plan of Care (Signed)
Problem: Vaginal/Cesarean Delivery  Goal: Maternal Status within defined parameters  Outcome: Progressing  Goal: Evidence of Fetal Well Being  Outcome: Progressing  Goal: Free from Maternal/Fetal Infection  Outcome: Progressing  Goal: Intrapartum management of pain/discomfort  Outcome: Progressing  Goal: Breasts are soft with nipple integrity intact  Outcome: Progressing  Goal: Gastrointestinal/Urinary management  Outcome: Progressing  Goal: Uterine management  Outcome: Progressing  Goal: Perineum will be clean, dry, and intact and without discharge or hematoma  Outcome: Progressing  Goal: VTE Prevention  Outcome: Progressing  Goal: Evidence of positive mother-baby interactions  Outcome: Progressing  Goal: Postpartum management of pain/discomfort  Outcome: Progressing

## 2021-04-30 NOTE — Progress Notes (Signed)
Report given to [facility]. Care relinquished at this time.

## 2021-04-30 NOTE — Progress Notes (Signed)
Dr. Landry Dyke in Pt's room to take cervidil out. Pt was given permission to shower. Pt off the monitor now to shower.

## 2021-04-30 NOTE — Anesthesia Procedure Notes (Signed)
Epidural    Patient location during procedure: L&D  Reason for block: Labor or C-section  Block at Surgeon's request: Yes    Start time: 04/30/2021 9:00 PM  End time: 04/30/2021 9:04 PM    Staffing  Performed: anesthesiologist   Anesthesiologist: Odette Fraction, MD    Pre-procedure Checklist   Completed: patient identified, site marked, surgical consent, pre-op evaluation, timeout performed, risks and benefits discussed, monitors and equipment checked, anesthesia consent given and correct site  Timeout Completed:  04/30/2021 8:59 PM    Epidural  Patient monitoring: Pulse oximetry and NIBP    Premedication: Meaningful Contact Maintained  Patient position: sitting    Skin Local: lidocaine 1%  Dose: 5 mL    Attempts  Number of attempts: 1                    Successful attempt  Interspace: L3-4  Approach: midline    Needle type: Touhy needle   Needle gauge: 17  Injection technique: LOR air  Epidural Space ID: 7 cm  CSF Return: No   Blood Return: No  Paresthesia Pain: No    Needle Placement  Needle type: Touhy needle   Needle gauge: 17  Injection technique: LOR air  CSF Return: No  Blood Return: No          Paresthesia Pain: No    Catheter Placement   Catheter type: end hole  Catheter size: 19 G  Catheter at skin depth: 13 cm  CSF Return: No  Blood Return: No  Test Dose:1.5 % lidocaine and negative  3 cc  Incremental injection: yes  Injection made incrementally with aspirations every 2 mL.    No Catheter IV/SA Signs or Symptoms    Assessment   Patient tolerated procedure well: Yes  Block Outcome: no complications, successful block and pain improved

## 2021-04-30 NOTE — Plan of Care (Signed)
Problem: Vaginal/Cesarean Delivery  Goal: Maternal Status within defined parameters  Outcome: Progressing  Goal: Evidence of Fetal Well Being  Outcome: Progressing  Goal: Free from Maternal/Fetal Infection  Outcome: Progressing  Goal: Intrapartum management of pain/discomfort  Outcome: Progressing  Goal: Breasts are soft with nipple integrity intact  Outcome: Progressing  Goal: Gastrointestinal/Urinary management  Outcome: Progressing  Goal: Uterine management  Outcome: Progressing  Goal: Perineum will be clean, dry, and intact and without discharge or hematoma  Outcome: Progressing  Goal: VTE Prevention  Outcome: Progressing  Goal: Evidence of positive mother-baby interactions  Outcome: Progressing  Goal: Postpartum management of pain/discomfort  Outcome: Progressing

## 2021-05-01 LAB — CBC AND DIFFERENTIAL
Absolute NRBC: 0 10*3/uL (ref 0.00–0.00)
Absolute NRBC: 0 10*3/uL (ref 0.00–0.00)
Basophils Absolute Automated: 0.01 10*3/uL (ref 0.00–0.08)
Basophils Absolute Automated: 0.01 10*3/uL (ref 0.00–0.08)
Basophils Automated: 0.2 %
Basophils Automated: 0.1 %
Eosinophils Absolute Automated: 0.02 10*3/uL (ref 0.00–0.44)
Eosinophils Absolute Automated: 0.03 10*3/uL (ref 0.00–0.44)
Eosinophils Automated: 0.4 %
Eosinophils Automated: 0.4 %
Hematocrit: 23.9 % — ABNORMAL LOW (ref 34.7–43.7)
Hematocrit: 35.7 % (ref 34.7–43.7)
Hgb: 7.5 g/dL — ABNORMAL LOW (ref 11.4–14.8)
Hgb: 11.2 g/dL — ABNORMAL LOW (ref 11.4–14.8)
Immature Granulocytes Absolute: 0.03 10*3/uL (ref 0.00–0.07)
Immature Granulocytes Absolute: 0.04 10*3/uL (ref 0.00–0.07)
Immature Granulocytes: 0.6 %
Immature Granulocytes: 0.5 %
Instrument Absolute Neutrophil Count: 3.9 10*3/uL (ref 1.10–6.33)
Instrument Absolute Neutrophil Count: 6.56 10*3/uL — ABNORMAL HIGH (ref 1.10–6.33)
Lymphocytes Absolute Automated: 0.73 10*3/uL (ref 0.42–3.22)
Lymphocytes Absolute Automated: 1 10*3/uL (ref 0.42–3.22)
Lymphocytes Automated: 14.4 %
Lymphocytes Automated: 12.1 %
MCH: 24.4 pg — ABNORMAL LOW (ref 25.1–33.5)
MCH: 24.3 pg — ABNORMAL LOW (ref 25.1–33.5)
MCHC: 31.4 g/dL — ABNORMAL LOW (ref 31.5–35.8)
MCHC: 31.4 g/dL — ABNORMAL LOW (ref 31.5–35.8)
MCV: 77.9 fL — ABNORMAL LOW (ref 78.0–96.0)
MCV: 77.4 fL — ABNORMAL LOW (ref 78.0–96.0)
MPV: 11 fL (ref 8.9–12.5)
MPV: 11.3 fL (ref 8.9–12.5)
Monocytes Absolute Automated: 0.38 10*3/uL (ref 0.21–0.85)
Monocytes Absolute Automated: 0.62 10*3/uL (ref 0.21–0.85)
Monocytes: 7.5 %
Monocytes: 7.5 %
Neutrophils Absolute: 3.9 10*3/uL (ref 1.10–6.33)
Neutrophils Absolute: 6.56 10*3/uL — ABNORMAL HIGH (ref 1.10–6.33)
Neutrophils: 76.9 %
Neutrophils: 79.4 %
Nucleated RBC: 0 /100 WBC (ref 0.0–0.0)
Nucleated RBC: 0 /100 WBC (ref 0.0–0.0)
Platelets: 146 10*3/uL (ref 142–346)
Platelets: 225 10*3/uL (ref 142–346)
RBC: 3.07 10*6/uL — ABNORMAL LOW (ref 3.90–5.10)
RBC: 4.61 10*6/uL (ref 3.90–5.10)
RDW: 16 % — ABNORMAL HIGH (ref 11–15)
RDW: 16 % — ABNORMAL HIGH (ref 11–15)
WBC: 5.07 10*3/uL (ref 3.10–9.50)
WBC: 8.26 10*3/uL (ref 3.10–9.50)

## 2021-05-01 MED ORDER — DIPHENHYDRAMINE HCL 50 MG/ML IJ SOLN
12.5000 mg | Freq: Four times a day (QID) | INTRAMUSCULAR | Status: DC | PRN
Start: 2021-05-01 — End: 2021-05-03
  Administered 2021-05-01: 12.5 mg via INTRAVENOUS
  Filled 2021-05-01: qty 1

## 2021-05-01 MED ORDER — MISOPROSTOL 200 MCG PO TABS
ORAL_TABLET | ORAL | Status: AC
Start: 2021-05-01 — End: 2021-05-02
  Administered 2021-05-02: 800 ug via RECTAL
  Filled 2021-05-01: qty 4

## 2021-05-01 MED ORDER — TRANEXAMIC ACID-NACL 1000-0.7 MG/100ML-% IV SOLN
INTRAVENOUS | Status: AC
Start: 2021-05-01 — End: 2021-05-02
  Administered 2021-05-02: 1000 mg via INTRAVENOUS
  Filled 2021-05-01: qty 100

## 2021-05-01 MED ORDER — OXYTOCIN-SODIUM CHLORIDE 30-0.9 UT/500ML-% IV SOLN
INTRAVENOUS | Status: AC
Start: 2021-05-01 — End: 2021-05-02
  Administered 2021-05-02: 7.5 [IU]/h via INTRAVENOUS
  Filled 2021-05-01: qty 500

## 2021-05-01 MED ORDER — OXYTOCIN-SODIUM CHLORIDE 30-0.9 UT/500ML-% IV SOLN
7.5000 [IU]/h | INTRAVENOUS | Status: DC | PRN
Start: 2021-05-02 — End: 2021-05-02
  Administered 2021-05-02: 7.5 [IU]/h via INTRAVENOUS

## 2021-05-01 MED ORDER — METHYLERGONOVINE MALEATE 0.2 MG/ML IJ SOLN
INTRAMUSCULAR | Status: AC
Start: 2021-05-01 — End: 2021-05-02
  Filled 2021-05-01: qty 1

## 2021-05-01 MED ORDER — DIPHENHYDRAMINE HCL 50 MG/ML IJ SOLN
12.5000 mg | Freq: Four times a day (QID) | INTRAMUSCULAR | Status: DC | PRN
Start: 2021-05-01 — End: 2021-05-02

## 2021-05-01 MED ORDER — OXYTOCIN-SODIUM CHLORIDE 30-0.9 UT/500ML-% IV SOLN
2.0000 m[IU]/min | INTRAVENOUS | Status: DC | PRN
Start: 2021-05-01 — End: 2021-05-02
  Administered 2021-05-01: 4 m[IU]/min via INTRAVENOUS

## 2021-05-01 MED ORDER — NALBUPHINE HCL 10 MG/ML SYRINGE
5.0000 mg | INTRAMUSCULAR | Status: DC | PRN
Start: 2021-05-01 — End: 2021-05-03

## 2021-05-01 MED ORDER — CARBOPROST TROMETHAMINE 250 MCG/ML IM SOLN
INTRAMUSCULAR | Status: AC
Start: 2021-05-01 — End: 2021-05-02
  Filled 2021-05-01: qty 1

## 2021-05-01 NOTE — Progress Notes (Signed)
 [facility]   OB LABOR NOTE     S: Pt reports no major complaints. Patient resting. Husband and RN at bedside. RN notes has gone up slow on Oxytocin due to intermittent late decels and periods of not picking up cxt's well (monitors adjusted)    O:   Vitals:    05/01/21 1518 05/01/21 1545 05/01/21 1600 05/01/21 1608   BP: 107/58 112/58 117/68 133/73   Pulse: 86 84 85 92   Resp: 18      Temp: 99.8 F (37.7 C)      TempSrc: Oral      SpO2: 99% 100% 99% 100%   Weight:       Height:         FHR: Baseline Rate: 120 BPM, Variability: Moderate, Pattern: prolonged decel after cervical check w/ nadir in 60-s x2.5 min then increased to 100s with slow RTB of 120's over several minutes (bolus, pos change, and cessation of Oxytocin), FHR Category: Category II  Ctx's: Contraction Frequency: 3  Cervix: Dilation: 6 Effacement (%): 100 Station: -1 @ 1535 per MD, caput noted.  Membrane Status: Spontaneous Color: Clear @ 0538    Meds: s/p Cook's catheter (out @ 0030), s/p Cytotec 50 mcg x5, Cervidil (out @ 1030 2/19), started Oxytocin on 8 (~12 noon 2/19, held @ 1610-9604, held again d/t prolonged decel)    Recent Labs   Lab 05/01/21  1211 05/01/21  0943 04/28/21  0930   WBC 8.26 5.07 5.44   Hgb 11.2* 7.5* 11.2*   Hematocrit 35.7 23.9* 35.1   Platelets 225 146 241     Assessment:  AMA G2P0010 IUP @ [redacted]w[redacted]d, s/p cervical ripening (2.5 days)  Now in active labor, unchanged dilation, but cervix completely effaced  Indication for IOL for GHTN (BP's have normalized, likely anxiety component)     Brow presentation (resolved), now OP with +caput asynclintic  Maternal-Fetal well-being: recent decel with spont RTB, now reassuring again  Anemia: Prior Hgb drop was spurious as expected, repeat WNL.    Plan:  - Continue current care  - Oxytocin held x30 min, resume at 4 mU/min and increase per low-dose protocol  - Pain control: epidural working well  - Discussed scenarios in which C-section would be necessary:               - failure to  progress (Arrest of descent/dilation)               - NRFHT w/ Oxytocin (held x2 already)              - malpresentation persists (appears to have resolved at this point)  - POC reviewed, questions addressed.  - Reasses in ~2-4 hrs once RUC's present, consider IUPC prn    Shireen Quan. Mayford Knife, MD, [facility]  [facility]

## 2021-05-01 NOTE — Progress Notes (Signed)
[facility]   OB LABOR NOTE     S: Pt reports no complaints. RN and husband at bedside.     O: Tc 98.6 (Tm 99.9)  Vitals:    05/01/21 2115 05/01/21 2129 05/01/21 2130 05/01/21 2145   BP: 132/69  137/74 137/63   Pulse: 86  99 (!) 112   Resp:       Temp:  98.6 F (37 C)     TempSrc:  Oral     SpO2:       Weight:       Height:         FHR: Baseline Rate: 125 BPM, Variability: Moderate, Pattern: Early decelerations, FHR Category: Category I  Ctx's: Contraction Frequency: 1-3  Cervix: Dilation: 10 Effacement (%): 100 Station: 1 @ 2110 (no brow, unable to tell position now due to caput, but previously OP). Ischial spines feel prominent  Membrane Status: Spontaneous Color: Clear @ 0538     Meds: s/p Cook's catheter (out @ 0030), s/p Cytotec 50 mcg x5, Cervidil (out @ 1030 2/19), started Oxytocin @ 20 (~12 noon 2/19, held @ 0830-1055, held again d/t prolonged decel)    Assessment:  G2P0010 IUP @ [redacted]w[redacted]d  Indication for IOL for GHTN (BP's mostly wnl, sporadic elevations)  Progressed to 2nd stage of labor  Maternal-Fetal well-being reassuring  Suspect OP - Pushed for 40 min thus far    Plan:  - Discussed expectations of 2nd stage and pushing well thus far so will continue   - Anticipate ~3 hrs of pushing, continue w/ RN  - Keep in lateral or tilted position  - Pain control: epidural working well  - POC reviewed, questions addressed.  - Reasses in ~30-60 min and prn    Shireen Quan. Mayford Knife, MD, [facility]  [facility] [place]

## 2021-05-01 NOTE — Transition of Care (Signed)
Report given to [place], RN. Pt care relinquished at this time.

## 2021-05-01 NOTE — Progress Notes (Addendum)
Interval OB L&D Note    Pt reports persistent itching (Benadryl prn)  Strip reviewed. Category 1, toco: q4-5 min, Oxytocin at 10  Discussed increasing Oxytocin to steady RUC's q2-3 min  VSS/Afebrile, Tm 99.9 (T 99.0-99.9 all day), sporadic BP elevations but mostly wnl.  IOL for GHTN, in active labor, previously brow presentation (resolved).  Will re-check cervix once adequate RUCs by timing and palpation.  Discussed with RN, adequate titration of Oxytocin q15-20 min.  Instructed RN to contact me if difficulty w/ increasing so that I can review and advise.  Re-assess in ~2 hours and prn.   POC reviewed w/ patient and RN    Idelle Leech, MD

## 2021-05-01 NOTE — Progress Notes (Signed)
[facility] OB Progress Note         Date Time: 05/01/21 6:03 AM  Patient Name: Heidi Espinoza, Heidi Espinoza      Subjective:   Patient comfortable with epidural     Objective:   BP 109/62   Pulse 88   Temp 98.3 F (36.8 C) (Oral)   Resp 18   Ht 1.715 m (5' 7.5")   Wt 94.2 kg (207 lb 9.6 oz)   LMP 07/28/2020 (Exact Date)   SpO2 98%   BMI 32.03 kg/m     Cervix: 5/80/-3 SROM clear , brow presentation ROT confirmed by bedside ultrasound   Membranes: ruptured, clear fluid  FHT: 125/moderate variability/+accels/ no decels   Contractions:   Categories: Category I    Medications: Cytotec 50 mcg PO, Pitocin, and Cervadil     Assessment:   36 y.o. G2P0010 at [redacted]w[redacted]d   Brow presentation ROT   -s/p Cytotec 50 mcg buccal x 5 ; s/p cervidil ;cook cath, currently on pit  GBS: negative.   Fetal Well-being: Category 1  Indication for [id number] hypertension     Plan:   Discussed with patient brow presentation . We reviewed that this is usually transitional and converts to more favorable presentation and vaginal delivery, but if brow presentation persists labor progress can be prolonged or arrested and requires cesarean delivery. All patients questions answered to her satisfaction and patient verbalized understanding.     Signed by: Emilee Hero, MD 05/01/2021

## 2021-05-01 NOTE — Plan of Care (Signed)
Problem: Vaginal/Cesarean Delivery  Goal: Maternal Status within defined parameters  Outcome: Progressing  Goal: Evidence of Fetal Well Being  Outcome: Progressing  Goal: Free from Maternal/Fetal Infection  Outcome: Progressing  Goal: Intrapartum management of pain/discomfort  Outcome: Progressing  Goal: Breasts are soft with nipple integrity intact  Outcome: Progressing  Goal: Gastrointestinal/Urinary management  Outcome: Progressing  Goal: Uterine management  Outcome: Progressing  Goal: Perineum will be clean, dry, and intact and without discharge or hematoma  Outcome: Progressing  Goal: VTE Prevention  Outcome: Progressing  Goal: Evidence of positive mother-baby interactions  Outcome: Progressing

## 2021-05-01 NOTE — Progress Notes (Signed)
[facility]   OB LABOR NOTE     S: Pt reports no major complaints. Comfortable w/ epidural, but a bit frustrated with length of process. She also is feels tired. RN and husband at bedside. Of note, Oxytocin was held earlier due to run of late decels not responsive to other measures per RN report.     O:   Vitals:    05/01/21 0943 05/01/21 1000 05/01/21 1013 05/01/21 1020   BP: 111/58 110/58 131/66 129/69   Pulse: 76 79 96 75   Resp:       Temp:   99 F (37.2 C)    TempSrc:   Oral    SpO2: 99% 98% 98% 97%   Weight:       Height:         FHR: Baseline Rate: 115 BPM, Variability: Moderate, Pattern: Accelerations, FHR Category: Category I  Ctx's: Contraction Frequency: 1-5 min  Cervix: Dilation: 5 Effacement (%): 80 Station: -3 @ 1045 per MD  Membrane Status: Spontaneous Color: Clear @ 0538  Meds: s/p Cook's catheter (out @ 0030), s/p Cytotec 50 mcg x5, Cervidil (out @ 1030 2/19), started Oxytocin (~12 noon 2/19, held @ 0830, resumed @ 1055)    Recent Labs   Lab 05/01/21  0943 04/28/21  0930 04/26/21  1142   WBC 5.07 5.44 5.71   Hgb 7.5* 11.2* 10.7*   Hematocrit 23.9* 35.1 33.8*   Platelets 146 241 226     Assessment:  G2P0010 IUP @ [redacted]w[redacted]d, s/p cervical ripening (2.5 days), now in active labor  Indication for IOL for GHTN (BP's have normalized, likely anxiety component)    Brow presentation (ROT), AMA  Maternal-Fetal well-being reassuring (Category I currently)  Anemia: Hgb dropped to 7 (likely spurious value vs dilutional), no obvious source for bleeding, stable    Plan:  - Continue current care  - Resume Oxytocin per low-dose protocol  - Recheck CBC (advised to draw from contralateral side to IV access)  - Pain control: epidural working well   - Discussed scenarios in which C-section would be necessary:    - failure to progress (Arrest of descent/dilation)   - NRFHT w/ Oxytocin   - malpresentation persists  - POC reviewed, questions addressed.  - Reasses in ~2-4 hrs and prn    Shireen Quan. Mayford Knife, MD,  [facility]  [facility] [place]

## 2021-05-01 NOTE — Nursing Progress Note (Signed)
Report given Isabelle Course, RN; relinquish care of pt at this time

## 2021-05-01 NOTE — Progress Notes (Addendum)
Unable to confirm presentation at this time; +U/S at bedside; Dr. Ester Rink notified; MD to come to bedside to evaluate

## 2021-05-02 ENCOUNTER — Encounter: Payer: Self-pay | Admitting: Obstetrics & Gynecology

## 2021-05-02 ENCOUNTER — Ambulatory Visit: Payer: Self-pay

## 2021-05-02 DIAGNOSIS — O9912 Other diseases of the blood and blood-forming organs and certain disorders involving the immune mechanism complicating childbirth: Secondary | ICD-10-CM

## 2021-05-02 DIAGNOSIS — Z3A39 39 weeks gestation of pregnancy: Secondary | ICD-10-CM

## 2021-05-02 DIAGNOSIS — O139 Gestational [pregnancy-induced] hypertension without significant proteinuria, unspecified trimester: Secondary | ICD-10-CM

## 2021-05-02 DIAGNOSIS — O99344 Other mental disorders complicating childbirth: Secondary | ICD-10-CM

## 2021-05-02 DIAGNOSIS — R079 Chest pain, unspecified: Secondary | ICD-10-CM

## 2021-05-02 DIAGNOSIS — D560 Alpha thalassemia: Secondary | ICD-10-CM

## 2021-05-02 DIAGNOSIS — O134 Gestational [pregnancy-induced] hypertension without significant proteinuria, complicating childbirth: Principal | ICD-10-CM

## 2021-05-02 DIAGNOSIS — F419 Anxiety disorder, unspecified: Secondary | ICD-10-CM

## 2021-05-02 MED ORDER — PROMETHAZINE HCL 25 MG RE SUPP
12.5000 mg | Freq: Four times a day (QID) | RECTAL | Status: DC | PRN
Start: 2021-05-02 — End: 2021-05-03

## 2021-05-02 MED ORDER — IBUPROFEN 600 MG PO TABS
600.0000 mg | ORAL_TABLET | Freq: Four times a day (QID) | ORAL | Status: DC
Start: 2021-05-02 — End: 2021-05-03
  Administered 2021-05-02 – 2021-05-03 (×5): 600 mg via ORAL
  Filled 2021-05-02 (×5): qty 1

## 2021-05-02 MED ORDER — MISOPROSTOL 200 MCG PO TABS
800.0000 ug | ORAL_TABLET | Freq: Once | ORAL | Status: DC | PRN
Start: 2021-05-02 — End: 2021-05-02

## 2021-05-02 MED ORDER — BISACODYL 10 MG RE SUPP
10.0000 mg | Freq: Every day | RECTAL | Status: DC | PRN
Start: 2021-05-02 — End: 2021-05-03

## 2021-05-02 MED ORDER — IBUPROFEN 600 MG PO TABS
600.0000 mg | ORAL_TABLET | Freq: Once | ORAL | Status: AC
Start: 2021-05-02 — End: 2021-05-02
  Administered 2021-05-02: 600 mg via ORAL
  Filled 2021-05-02: qty 1

## 2021-05-02 MED ORDER — OXYCODONE HCL 5 MG PO TABS
5.0000 mg | ORAL_TABLET | ORAL | Status: DC | PRN
Start: 2021-05-02 — End: 2021-05-03

## 2021-05-02 MED ORDER — ONDANSETRON HCL 4 MG/2ML IJ SOLN
4.0000 mg | Freq: Three times a day (TID) | INTRAMUSCULAR | Status: DC | PRN
Start: 2021-05-02 — End: 2021-05-03

## 2021-05-02 MED ORDER — OXYCODONE HCL 5 MG PO TABS
5.0000 mg | ORAL_TABLET | Freq: Once | ORAL | Status: DC | PRN
Start: 2021-05-02 — End: 2021-05-02

## 2021-05-02 MED ORDER — TETANUS-DIPHTH-ACELL PERTUSSIS 5-2.5-18.5 LF-MCG/0.5 IM SUSP
0.5000 mL | INTRAMUSCULAR | Status: DC | PRN
Start: 2021-05-02 — End: 2021-05-03

## 2021-05-02 MED ORDER — HYDROCORTISONE 1 % EX OINT
TOPICAL_OINTMENT | Freq: Three times a day (TID) | CUTANEOUS | Status: DC | PRN
Start: 2021-05-02 — End: 2021-05-03
  Filled 2021-05-02: qty 28.4

## 2021-05-02 MED ORDER — NALOXONE HCL 0.4 MG/ML IJ SOLN (WRAP)
0.2000 mg | INTRAMUSCULAR | Status: DC | PRN
Start: 2021-05-02 — End: 2021-05-03

## 2021-05-02 MED ORDER — ACETAMINOPHEN 650 MG RE SUPP
650.0000 mg | RECTAL | Status: DC | PRN
Start: 2021-05-02 — End: 2021-05-03

## 2021-05-02 MED ORDER — NALOXONE HCL 0.4 MG/ML IJ SOLN (WRAP)
0.2000 mg | INTRAMUSCULAR | Status: DC | PRN
Start: 2021-05-02 — End: 2021-05-02

## 2021-05-02 MED ORDER — SOD CITRATE-CITRIC ACID 500-334 MG/5ML PO SOLN
30.0000 mL | Freq: Once | ORAL | Status: DC | PRN
Start: 2021-05-02 — End: 2021-05-03

## 2021-05-02 MED ORDER — NIFEDIPINE 10 MG PO CAPS
20.0000 mg | ORAL_CAPSULE | Freq: Once | ORAL | Status: DC | PRN
Start: 2021-05-02 — End: 2021-05-03

## 2021-05-02 MED ORDER — ACETAMINOPHEN 325 MG PO TABS
650.0000 mg | ORAL_TABLET | ORAL | Status: DC | PRN
Start: 2021-05-02 — End: 2021-05-03
  Administered 2021-05-02 – 2021-05-03 (×6): 650 mg via ORAL
  Filled 2021-05-02 (×6): qty 2

## 2021-05-02 MED ORDER — NIFEDIPINE 10 MG PO CAPS
10.0000 mg | ORAL_CAPSULE | Freq: Once | ORAL | Status: DC | PRN
Start: 2021-05-02 — End: 2021-05-03

## 2021-05-02 MED ORDER — PROMETHAZINE HCL 12.5 MG PO TABS
12.5000 mg | ORAL_TABLET | Freq: Four times a day (QID) | ORAL | Status: DC | PRN
Start: 2021-05-02 — End: 2021-05-03

## 2021-05-02 MED ORDER — SIMETHICONE 80 MG PO CHEW
80.0000 mg | CHEWABLE_TABLET | Freq: Four times a day (QID) | ORAL | Status: DC | PRN
Start: 2021-05-02 — End: 2021-05-03

## 2021-05-02 MED ORDER — MEASLES, MUMPS & RUBELLA VAC IJ SOLR
0.5000 mL | INTRAMUSCULAR | Status: DC | PRN
Start: 2021-05-02 — End: 2021-05-03

## 2021-05-02 MED ORDER — LANOLIN EX OINT
TOPICAL_OINTMENT | CUTANEOUS | Status: DC | PRN
Start: 2021-05-02 — End: 2021-05-03
  Filled 2021-05-02: qty 7

## 2021-05-02 MED ORDER — IBUPROFEN 600 MG PO TABS
600.0000 mg | ORAL_TABLET | Freq: Once | ORAL | Status: DC | PRN
Start: 2021-05-02 — End: 2021-05-02

## 2021-05-02 MED ORDER — SENNOSIDES-DOCUSATE SODIUM 8.6-50 MG PO TABS
1.0000 | ORAL_TABLET | Freq: Every evening | ORAL | Status: DC | PRN
Start: 2021-05-02 — End: 2021-05-03

## 2021-05-02 MED ORDER — ONDANSETRON 4 MG PO TBDP
4.0000 mg | ORAL_TABLET | Freq: Three times a day (TID) | ORAL | Status: DC | PRN
Start: 2021-05-02 — End: 2021-05-03

## 2021-05-02 MED ORDER — METHYLERGONOVINE MALEATE 0.2 MG/ML IJ SOLN
0.2000 mg | INTRAMUSCULAR | Status: DC | PRN
Start: 2021-05-02 — End: 2021-05-02

## 2021-05-02 MED ORDER — WITCH HAZEL EX PADS (WRAP)
1.0000 | MEDICATED_PAD | CUTANEOUS | Status: DC | PRN
Start: 2021-05-02 — End: 2021-05-03
  Administered 2021-05-02: 1 via TOPICAL
  Filled 2021-05-02: qty 40

## 2021-05-02 MED ORDER — MISOPROSTOL 200 MCG PO TABS
800.0000 ug | ORAL_TABLET | Freq: Once | ORAL | Status: DC | PRN
Start: 2021-05-02 — End: 2021-05-03

## 2021-05-02 MED ORDER — SODIUM CHLORIDE 0.9 % IV SOLN
6.2500 mg | Freq: Four times a day (QID) | INTRAVENOUS | Status: DC | PRN
Start: 2021-05-02 — End: 2021-05-03

## 2021-05-02 MED ORDER — PRENATAL PLUS IRON 29-1 MG PO TABS
1.0000 | ORAL_TABLET | Freq: Every day | ORAL | Status: DC
Start: 2021-05-02 — End: 2021-05-03
  Administered 2021-05-02 – 2021-05-03 (×2): 1 via ORAL
  Filled 2021-05-02 (×2): qty 1

## 2021-05-02 MED ORDER — CALCIUM CARBONATE ANTACID 500 MG PO CHEW
1000.0000 mg | CHEWABLE_TABLET | Freq: Three times a day (TID) | ORAL | Status: DC | PRN
Start: 2021-05-02 — End: 2021-05-03

## 2021-05-02 MED ORDER — ALUM & MAG HYDROXIDE-SIMETH 200-200-20 MG/5ML PO SUSP
30.0000 mL | Freq: Four times a day (QID) | ORAL | Status: DC | PRN
Start: 2021-05-02 — End: 2021-05-03

## 2021-05-02 MED ORDER — BENZOCAINE 20% +/- MENTHOL 0.5% EX AERO (WRAP)
1.0000 | INHALATION_SPRAY | CUTANEOUS | Status: DC | PRN
Start: 2021-05-02 — End: 2021-05-03
  Administered 2021-05-02: 1 via TOPICAL
  Filled 2021-05-02: qty 85

## 2021-05-02 MED ORDER — MAGNESIUM HYDROXIDE 400 MG/5ML PO SUSP
30.0000 mL | Freq: Four times a day (QID) | ORAL | Status: DC | PRN
Start: 2021-05-02 — End: 2021-05-03

## 2021-05-02 NOTE — Plan of Care (Signed)
Problem: Safety  Goal: Patient will be free from injury during hospitalization  Outcome: Progressing  Flowsheets (Taken 05/02/2021 [phone])  Patient will be free from injury during hospitalization:   Assess patient's risk for falls and implement fall prevention plan of care per policy   Provide and maintain safe environment   Ensure appropriate safety devices are available at the bedside   Use appropriate transfer methods   Include patient/ family/ care giver in decisions related to safety   Hourly rounding     Problem: Pain  Goal: Pain at adequate level as identified by patient  Outcome: Progressing  Flowsheets (Taken 05/02/2021 0644)  Pain at adequate level as identified by patient:   Identify patient comfort function goal   Assess pain on admission, during daily assessment and/or before any "as needed" intervention(s)   Reassess pain within 30-60 minutes of any procedure/intervention, per Pain Assessment, Intervention, Reassessment (AIR) Cycle   Evaluate if patient comfort function goal is met   Offer non-pharmacological pain management interventions     Pt admitted to room 3027 from L&D. Fundal checked with L&D nurse at bedside. Educated pt on pumping. Answered pts questions and concerns. Due to void 2x. Safety precautions in place.

## 2021-05-02 NOTE — Plan of Care (Signed)
Problem: Vaginal/Cesarean Delivery  Goal: Maternal Status within defined parameters  05/02/2021 0838 by Karin Golden, RN  Flowsheets (Taken 05/02/2021 [phone])  Maternal status with defined parameters:   Monitor/assess vital signs   Perform physical assessment per phase of care  05/02/2021 0838 by Karin Golden, RN  Outcome: Progressing     Pt stable at this time, vital signs WNL.  Pt denies h/a, blurred vision, n/v, or epigastric pain at this time, DTR 1+ with no clonus.  Fundus firm, midline at U/U with scant bleeding and no clots observed.  LR infusing at maintenance rate.  Pt due to void x1, instructed to call RN.  Pt reports mild cramping, but well managed with medication administration and rest.  Pt states that she will can RN for medication.  Infant remains in NICU for further evaluation.  RN reviewed pumping instructions and frequency with MOB.  Pt verbalizes understanding and reports no further questions at this time. Will continue to monitor.

## 2021-05-02 NOTE — Anesthesia Postprocedure Evaluation (Signed)
Anesthesia Post Evaluation    Patient: Heidi Espinoza    * No procedures listed *    Anesthesia type: epidural    Last Vitals:   Vitals Value Taken Time   BP 130/73 05/02/21 0542   Temp 37.5 C (99.5 F) 05/02/21 0542   Pulse 91 05/02/21 0542   Resp 16 05/02/21 0628   SpO2 96 05/02/21 0628             BP 130/73   Pulse 91   Temp 37.5 C (99.5 F) (Oral)   Resp 16   Ht 1.715 m (5' 7.5")   Wt 94.2 kg (207 lb 9.6 oz)   LMP 07/28/2020 (Exact Date)   SpO2 96%   Breastfeeding Unknown   BMI 32.03 kg/m     Anesthesia Post Evaluation:     Patient Evaluated: floor  Patient Participation: complete - patient participated  Level of Consciousness: awake and alert  Pain Score: 3  Pain Management: satisfactory to patient  Multimodal analgesia pain management approach    Airway Patency: patent    Anesthetic complications: No      PONV Status: none    Cardiovascular status: acceptable  Respiratory status: acceptable  Hydration status: acceptable        Signed by: Tacey Heap, MD, 05/02/2021 6:28 AM

## 2021-05-02 NOTE — Progress Notes (Signed)
Interval Labor Note    In the room pushing with patient for the past 80+ min.   She was doing well, but started feeling pelvic pain and became panicked  Control was decompensated and thus pushing significantly effected.   During this time additional nursing staff requested to provide both emotional and coaching support.   She proclaimed she could not do it anymore but appeared very close to crowning  Patient requested Oxytocin be discontinued; this was done.   Called Anesthesia and bolus was given to patient and allowed 15+ minutes to set in.  Discussed options: continue to push to effect SVD vs C-section; patient agreed to continue pushing once more comfortable.  Of note, baby has significant caput and is likely OP.   FHR = Category II baseline fluctuates between 115-120 when pushing to 140s when breaking, expected variables, but no e/o impending acidosis.    I do not feel she is a good candidate for vacuum.  Resumed pushing and Oxytocin; effort is not optimal currently but will coach there) and Re-assess progress in 30-45 min     Idelle Leech, MD

## 2021-05-02 NOTE — Plan of Care (Signed)
Problem: Vaginal/Cesarean Delivery  Goal: Maternal Status within defined parameters  Outcome: Progressing  Flowsheets (Taken 05/02/2021 2250)  Maternal status with defined parameters:   Monitor/assess vital signs   Perform physical assessment per phase of care     Problem: Pain  Goal: Pain at adequate level as identified by patient  Outcome: Progressing  Flowsheets (Taken 05/02/2021 2250)  Pain at adequate level as identified by patient:   Identify patient comfort function goal   Assess pain on admission, during daily assessment and/or before any "as needed" intervention(s)   Reassess pain within 30-60 minutes of any procedure/intervention, per Pain Assessment, Intervention, Reassessment (AIR) Cycle   Evaluate if patient comfort function goal is met   Offer non-pharmacological pain management interventions   Evaluate patient's satisfaction with pain management progress

## 2021-05-02 NOTE — Progress Notes - NICU (Addendum)
This note was copied from the baby's chart.   05/02/21 1647   Healthcare Decisions   Interviewed: Parent   Social History/Developmental (NICU/PEDS)   Breastfeeding Status Yes   Prenatal care since Regular since 1st trimester   Postpartum Depression Awareness yes   Prior to admission   Is the father of the baby involved/supportive? Yes, FOB involved   Mother's Employment Status Full time   Father's Employment Status Full time   Type of Residence Private residence   [employer] (PEDS) None   Discharge Planning (Peds)   Mode of transportation: Private car (family member)   Maternity Leave yes   Needs Supplies: No needs     CM met with MOB and FOB to complete assessment due to new born being in the NICO.     Name of baby: Heidi Espinoza    Number of kids:0    How do they plan on feeding baby: Breast Milk     Name of Pediatrician: unknown    Does MOB have a hx of mental illness, substance abuse, or trauma/abuse: N/A    Is MOB aware of postpartum depression/postpartum anxiety:   No current symptoms or concerns. CM discussed Postpartum Disorders. MOB was given pamphlets/information. MOB has a plan of action to contact current OBGYN sign/symptoms arise.     Support system: Good support system    Employment status: employed    Programmer, applications: WIC/SSI/SNAP: going to apply for [employer]    Do parents have supplies/living situation/transportation: YES    MOB and FOB had no additional questions or concerns noted at this time.     Milus Height, MSW  [facility]  Case Manager Social Worker I

## 2021-05-02 NOTE — Consults (Signed)
Consult received for "history of anxiety". CM met with MOB and introduced role.     Is MOB aware of postpartum depression/postpartum anxiety:   No current symptoms or concerns. CM discussed Postpartum Disorders. MOB was given pamphlets/information. MOB has a plan of action to contact current OBGYN sign/symptoms arise.     Milus Height, MSW  [facility]  Case Manager Social Worker I

## 2021-05-02 NOTE — Consults (Signed)
Visited this P1 pt to offer lactation support. She plans on breastfeeding baby and pumping when she goes back to work. Her baby is up in NICU for respiratory distress. Pt has hx of a thyroid nodule that was removed when she was in third grade. Reviewed recommended frequency and duration for pumping. Pt given pumping log and reviewed the expected milk volume per day postpartum.  Educated pt on hand expression and she return demonstrated. No colostrum seen yet but assured mom that not getting any colostrum the first day is normal. Gradually she will start to get drops of colostrum with pumping and hand expression over the next two days.  Educated mom that it takes 3-5 days for her transitional milk to come in and then she will see more milk volume. Pt fitted with 25 mm flange and pumped comfortably for 20 min on 25% suction. Educated mom on cleaning of pump parts, along with breast milk storage (getting milk up to NICU within an hour of pumping and timing/dating syringe). Pt given pump rental form if needed. Expected length of stay for baby is not known at this time yet. Pt denies any further questions at this time. Lactation number written on board.

## 2021-05-02 NOTE — Progress Notes (Incomplete)
Heidi Espinoza  [birth year]  35 y.o.    PHARMACY _________________    Delivery Date/Time: 05/02/2021  2:56 AM        Delivery Type:  Vaginal, Spontaneous    ROM Date/Time:     Laceration Type:     Episiotomy:         Anesthesia  _________________    GP: G2P0010 EBL: :  , 0    OB:    Idelle Leech, MD  Allergies   Allergen Reactions    Penicillins Hives, Other (See Comments) and Rash     Pt unsure; reaction as child       Other Environmental      seasonal       PRENATAL  Lab Results   Component Value Date    ABORH O POS 04/28/2021    HEPBSAG Non-Reactive 10/19/2020    RUBELLAABIGG 2.13 10/19/2020    RPR Nonreactive 10/19/2020       Past Medical History:   Diagnosis Date    Anxiety     Disorder of thyroid     Hypertension      Past Surgical History:   Procedure Laterality Date    THYROID CYST EXCISION Right     in 3rd grade, path = benign    WISDOM TOOTH EXTRACTION Bilateral 09/2015        Vaccine Screening:           Tdap:       Wants    Given    Declined   Had  Flu:     Wants   Given  Declined   Had    Rhogam:   Needs   Given   Ordered []    MMR:    Needs    Given    Declined       Sch Meds:________________________      Stool Softeners_________________           PRN Meds: _______________________     PAIN REASSESSMENT DUE_______________________     Fundal  __________ Elpidio Galea __________ Breast _________    Incision _________     IV:  __________   Foley:  __________  Void:  []   []       Discharge Videos   []      Safety Contract:  []   ________________________________________________________________________    Hospital Peds:    Home Peds:    H&P:  []         Baby Gender: Female   Gestation:  [redacted]w[redacted]d  APGAR:    and       Birth Weight:                 Feeding Type:       CDC Growth Scale:    Void:  []        Stool:  []        Bath:  []        Hep B:  []      Eye & Thigh:  []        Blood Type: __________     Labs:___________________________________________________       Hear:     []    CID:  []                  TCB:  ______ @_____   hr   CCHD:  []        PKU:    []         Circ:      []   CSC:    []                 My Chart Completed   []      Completion of PKU Labeling   []   ____________________________________    LACTATION    Lactation consult  - Ordered / Completed       Previous hx________________              Breast pump initiated  []         Nipple shield  []         Breastfeeding class  []     Breast pump rental  []

## 2021-05-03 LAB — CBC AND DIFFERENTIAL
Absolute NRBC: 0 10*3/uL (ref 0.00–0.00)
Basophils Absolute Automated: 0.02 10*3/uL (ref 0.00–0.08)
Basophils Automated: 0.2 %
Eosinophils Absolute Automated: 0.14 10*3/uL (ref 0.00–0.44)
Eosinophils Automated: 1.7 %
Hematocrit: 31.1 % — ABNORMAL LOW (ref 34.7–43.7)
Hgb: 9.8 g/dL — ABNORMAL LOW (ref 11.4–14.8)
Immature Granulocytes Absolute: 0.03 10*3/uL (ref 0.00–0.07)
Immature Granulocytes: 0.4 %
Instrument Absolute Neutrophil Count: 6.24 10*3/uL (ref 1.10–6.33)
Lymphocytes Absolute Automated: 1.25 10*3/uL (ref 0.42–3.22)
Lymphocytes Automated: 15.3 %
MCH: 24 pg — ABNORMAL LOW (ref 25.1–33.5)
MCHC: 31.5 g/dL (ref 31.5–35.8)
MCV: 76.2 fL — ABNORMAL LOW (ref 78.0–96.0)
MPV: 11.4 fL (ref 8.9–12.5)
Monocytes Absolute Automated: 0.51 10*3/uL (ref 0.21–0.85)
Monocytes: 6.2 %
Neutrophils Absolute: 6.24 10*3/uL (ref 1.10–6.33)
Neutrophils: 76.2 %
Nucleated RBC: 0 /100 WBC (ref 0.0–0.0)
Platelets: 212 10*3/uL (ref 142–346)
RBC: 4.08 10*6/uL (ref 3.90–5.10)
RDW: 16 % — ABNORMAL HIGH (ref 11–15)
WBC: 8.19 10*3/uL (ref 3.10–9.50)

## 2021-05-03 MED ORDER — IBUPROFEN 600 MG PO TABS
600.0000 mg | ORAL_TABLET | Freq: Four times a day (QID) | ORAL | 1 refills | Status: DC | PRN
Start: 2021-05-03 — End: 2021-05-04

## 2021-05-03 MED ORDER — ACETAMINOPHEN 325 MG PO TABS
650.0000 mg | ORAL_TABLET | Freq: Four times a day (QID) | ORAL | 1 refills | Status: AC | PRN
Start: 2021-05-03 — End: ?

## 2021-05-03 NOTE — Discharge Summary (Signed)
OB Delivery Discharge Summary    Date:05/03/2021   Patient Name: Heidi Espinoza, Heidi Espinoza  MRN: [id number] / DOB: 1985/11/03     Date of Admission:   04/28/2021  Date of Discharge:   05/03/2021     Active Hospital Problems    Diagnosis POA    Principal Problem: Gestational hypertension affecting first pregnancy Yes    SVD (spontaneous vaginal delivery) Not Applicable    Term birth of newborn female Not Applicable    Prolonged second stage of labor due to poor maternal effort No    High-risk pregnancy in third trimester Not Applicable    Thalassemia alpha carrier Yes    AMA (advanced maternal age) multigravida 35+ Not Applicable    Generalized anxiety disorder Yes      Resolved Hospital Problems   No resolved problems to display.       Admitting Diagnosis:   IUP @ [redacted]w[redacted]d    Discharge Dx:   S/p Normal Spontaneous Vaginal Delivery  Term Birth female  Infant  As above    Treatment Team:   Treatment Team:   Attending Provider: Idelle Leech, MD   Delivering Physician: Idelle Leech, MD  Discharging Physician: Idelle Leech, MD    Procedures performed:   Normal Spontaneous Vaginal Delivery    EBL: Estimated Blood Loss: 300 mL (Filed from Delivery Summary)  Anesthesia: Epidural  Rupture type: Spontaneous / Rupture date/time: 05/01/21  / Fluid color: Clear    Date/Time Delivery:  05/02/2021  @ 2:56 AM   Infant: female   APGARs:  1 Minute Apgar:     1   5 Minute Apgar:     3   Weight: 7 lb 9 oz (3430 g)     Hospital Course:   This 36 y.o. female G2P1011 patient at [redacted]w[redacted]d was admitted to labor and delivery for:   Gestational hypertension and induction of labor     Post-partum course was uncomplicated and patient was stable for discharge on PPD#2 meeting all criteria including: tolerating regular diet, pain controlled with PO meds, voiding on own, ambulating without difficulty, stable vital signs and normal exam.     Condition at Discharge: stable     Today:   BP 115/76   Pulse 68   Temp 98.1 F (36.7 C) (Oral)    Resp 20   Ht 1.715 m (5' 7.5")   Wt 94.2 kg (207 lb 9.6 oz)   LMP 07/28/2020 (Exact Date)   SpO2 98%   Breastfeeding Yes Comment: Baby unable to breastfeed yet, up in NICU  BMI 32.03 kg/m   Ranges for the last 24 hours:  Temp:  [97.3 F (36.3 C)-98.1 F (36.7 C)] 98.1 F (36.7 C)  Heart Rate:  [66-84] 68  Resp Rate:  [18-20] 20  BP: (105-118)/(62-76) 115/76    Last set of labs   Recent Labs   Lab 05/03/21  1027 05/01/21  1211 05/01/21  0943   WBC 8.19 8.26 5.07   Hgb 9.8* 11.2* 7.5*   Hematocrit 31.1* 35.7 23.9*   Platelets 212 225 146         GBS status: Negative      Discharge Instructions:      Follow-up Information       Pcp, None, MD .               Idelle Leech, MD. Schedule an appointment as soon as possible for a visit in 1 week(s).    Specialty: Obstetrics and Gynecology  Why: BP check  Contact information:  [address]  [place] [address]  [phone]               Adrienne Mocha, MD. Schedule an appointment as soon as possible for a visit in 6 week(s).    Specialty: Obstetrics and Gynecology  Why: postpartum visit  Contact information:  [address]  [place] [address]  [phone]                             Discharge Diet: Regular Diet  Disposition:  Home or Self Care     Discharge Medication List        Taking      acetaminophen 325 MG tablet  Dose: 650 mg  What changed:   how much to take  when to take this  reasons to take this  Commonly known as: TYLENOL  Take 2 tablets (650 mg) by mouth every 6 (six) hours as needed for Pain     Breast Pump Misc  Dispense 1 double electric breast pump for breastfeeding mother for use for 12 months.     ibuprofen 600 MG tablet  Dose: 600 mg  Commonly known as: ADVIL  Take 1 tablet (600 mg) by mouth every 6 (six) hours as needed for Pain     Prenatal Adult Gummy/DHA/FA 0.4-25 MG Chew  Chew by mouth            STOP taking these medications      calcium carbonate 500 MG chewable tablet  Commonly known  as: TUMS            Minutes spent coordinating discharge and reviewing discharge plan: 25-30 minutes    Signed by: Emilee Hero, MD

## 2021-05-03 NOTE — Discharge Instr - AVS First Page (Addendum)
Reason for your Hospital Admission:  Induction of labor for gestational hypertension       Instructions for after your discharge:     Follow up appointment:  - 6 weeks for post partum check    Prescriptions given:  - Motrin 600 mg 1 tablet every 6-8 hours as needed    Over-the-Counter Meds:  - Tylenol 500 mg 1-2 tabs every 6 hrs (MAX: 4000 mg per day)  - Colace stool softener twice daily until having regular bowel movements  - Miralax stool softener as needed for constipation  - Dulcolax suppository as needed if no BM by Day#4  - Simethicone (Gas X) as needed for gas pain   - Vitron C (iron supplement) once daily (ONLY IF instructed by your doctor to do so)    Call if you experience any of the following:   Fever - Oral temperature greater than 100.4 degrees F  Foul-smelling vaginal discharge  Headache unrelieved by "pain meds"  Difficulty urinating  Breasts reddened, hard, hot to the touch  Nipple discharge which is foul-smelling or contains pus  Increased pain at the site of the laceration  Difficulty breathing with or without chest pain  New calf pain especially if only on one side  Sudden, continuing increased vaginal bleeding (saturating pad every 1 hour or more)  Excessive blood clots (spherical  greater golf-ball size or flat greater than palm sizew)  Unrelieved feelings of:  Inability to cope  Sadness  Anxiety  Lack of interest in baby  Insomnia  Crying     What to do at home:  See patient education handouts for full information  Resume activity gradually   Don't lift anything heavier than baby and carrier until OK'd by your Physician  No sex, tampons, swimming or tub baths for at least 6 weeks or until OK'd by your Physician  You may shower normally  Take care of yourself by sleeping/resting as much as possible  Eat regular nutritious meals  Take pain medication as prescribed whenever you need them  To avoid/relieve constipation take stool softeners if advised   Increase fluid intake. Drink lots of  water  Increase fiber in your diet     Refer to Newborn Discharge Instructions for any concerns

## 2021-05-03 NOTE — Plan of Care (Signed)
Problem: Vaginal/Cesarean Delivery  Goal: Maternal Status within defined parameters  Outcome: Completed  Goal: Evidence of Fetal Well Being  Outcome: Completed  Goal: Free from Maternal/Fetal Infection  Outcome: Completed  Goal: Intrapartum management of pain/discomfort  Outcome: Completed  Goal: Breasts are soft with nipple integrity intact  Outcome: Completed  Goal: Gastrointestinal/Urinary management  Outcome: Completed  Goal: Uterine management  Outcome: Completed  Goal: Perineum will be clean, dry, and intact and without discharge or hematoma  Outcome: Completed  Goal: VTE Prevention  Outcome: Completed  Goal: Evidence of positive mother-baby interactions  Outcome: Completed  Goal: Postpartum management of pain/discomfort  Outcome: Completed     Problem: Moderate/High Fall Risk Score >5  Goal: Patient will remain free of falls  Outcome: Completed     Problem: Compromised Tissue integrity  Goal: Damaged tissue is healing and protected  Outcome: Completed  Goal: Nutritional status is improving  Outcome: Completed     Problem: Safety  Goal: Patient will be free from injury during hospitalization  Outcome: Completed  Goal: Patient will be free from infection during hospitalization  Outcome: Completed     Problem: Pain  Goal: Pain at adequate level as identified by patient  Outcome: Completed   Discharge order entered.    [place] instructions given.  Questions answered.  Mom [place]'d.

## 2021-05-03 NOTE — Progress Notes (Signed)
[facility] OB Postpartum Note       Subjective:  Pt doing well. Reports pain well controlled. Ambulating, tolerating regular diet, + flatus. Voiding spontaneously. Minimal lochia. Breastfeeding without difficulty  Infant doing well.    Objective:  Vital Signs:   Vitals:    05/03/21 0818   BP: 109/62   Pulse: 70   Resp: 18   Temp: 97.3 F (36.3 C)   SpO2: 98%     Gen: No acute distress, normal affect  Breast: soft, non tender  Abdomen: fundus firm, non tender, at U-1  Extremities: no edema, no calf tenderness    Recent Labs   Lab 05/01/21  1211   WBC 8.26   Hgb 11.2*   Hematocrit 35.7   Platelets 225         Assessment/Plan:  Stable - Postpartum Day 1 s/p SVD    Routine postpartum care  Lactation class/consult  D/c home today       Signed by: Emilee Hero, MD

## 2021-05-04 ENCOUNTER — Other Ambulatory Visit (INDEPENDENT_AMBULATORY_CARE_PROVIDER_SITE_OTHER): Payer: Self-pay | Admitting: Residents

## 2021-05-04 ENCOUNTER — Encounter (HOSPITAL_BASED_OUTPATIENT_CLINIC_OR_DEPARTMENT_OTHER): Payer: Self-pay | Admitting: Obstetrics & Gynecology

## 2021-05-04 ENCOUNTER — Emergency Department

## 2021-05-04 ENCOUNTER — Inpatient Hospital Stay
Admission: EM | Admit: 2021-05-04 | Discharge: 2021-05-04 | DRG: 776 | Disposition: A | Discharge status: Home / Self Care | Attending: Internal Medicine | Admitting: Internal Medicine

## 2021-05-04 ENCOUNTER — Inpatient Hospital Stay

## 2021-05-04 DIAGNOSIS — I509 Heart failure, unspecified: Secondary | ICD-10-CM

## 2021-05-04 DIAGNOSIS — F419 Anxiety disorder, unspecified: Secondary | ICD-10-CM

## 2021-05-04 DIAGNOSIS — F411 Generalized anxiety disorder: Secondary | ICD-10-CM

## 2021-05-04 DIAGNOSIS — O163 Unspecified maternal hypertension, third trimester: Secondary | ICD-10-CM

## 2021-05-04 DIAGNOSIS — O99344 Other mental disorders complicating childbirth: Secondary | ICD-10-CM

## 2021-05-04 DIAGNOSIS — O134 Gestational [pregnancy-induced] hypertension without significant proteinuria, complicating childbirth: Secondary | ICD-10-CM

## 2021-05-04 DIAGNOSIS — O9953 Diseases of the respiratory system complicating the puerperium: Principal | ICD-10-CM | POA: Diagnosis present

## 2021-05-04 DIAGNOSIS — E876 Hypokalemia: Secondary | ICD-10-CM | POA: Diagnosis present

## 2021-05-04 DIAGNOSIS — O9912 Other diseases of the blood and blood-forming organs and certain disorders involving the immune mechanism complicating childbirth: Secondary | ICD-10-CM

## 2021-05-04 DIAGNOSIS — J810 Acute pulmonary edema: Secondary | ICD-10-CM | POA: Diagnosis present

## 2021-05-04 DIAGNOSIS — D560 Alpha thalassemia: Secondary | ICD-10-CM

## 2021-05-04 DIAGNOSIS — O99285 Endocrine, nutritional and metabolic diseases complicating the puerperium: Secondary | ICD-10-CM | POA: Diagnosis present

## 2021-05-04 DIAGNOSIS — Z3A39 39 weeks gestation of pregnancy: Secondary | ICD-10-CM

## 2021-05-04 DIAGNOSIS — Z20822 Contact with and (suspected) exposure to covid-19: Secondary | ICD-10-CM | POA: Diagnosis present

## 2021-05-04 DIAGNOSIS — R0902 Hypoxemia: Secondary | ICD-10-CM

## 2021-05-04 LAB — CBC AND DIFFERENTIAL
Absolute NRBC: 0 10*3/uL (ref 0.00–0.00)
Basophils Absolute Automated: 0.02 10*3/uL (ref 0.00–0.08)
Basophils Automated: 0.2 %
Eosinophils Absolute Automated: 0.13 10*3/uL (ref 0.00–0.44)
Eosinophils Automated: 1.4 %
Hematocrit: 31.3 % — ABNORMAL LOW (ref 34.7–43.7)
Hgb: 9.8 g/dL — ABNORMAL LOW (ref 11.4–14.8)
Immature Granulocytes Absolute: 0.05 10*3/uL (ref 0.00–0.07)
Immature Granulocytes: 0.5 %
Instrument Absolute Neutrophil Count: 7.27 10*3/uL — ABNORMAL HIGH (ref 1.10–6.33)
Lymphocytes Absolute Automated: 1.36 10*3/uL (ref 0.42–3.22)
Lymphocytes Automated: 14.6 %
MCH: 24.3 pg — ABNORMAL LOW (ref 25.1–33.5)
MCHC: 31.3 g/dL — ABNORMAL LOW (ref 31.5–35.8)
MCV: 77.7 fL — ABNORMAL LOW (ref 78.0–96.0)
MPV: 10.8 fL (ref 8.9–12.5)
Monocytes Absolute Automated: 0.51 10*3/uL (ref 0.21–0.85)
Monocytes: 5.5 %
Neutrophils Absolute: 7.27 10*3/uL — ABNORMAL HIGH (ref 1.10–6.33)
Neutrophils: 77.8 %
Nucleated RBC: 0 /100 WBC (ref 0.0–0.0)
Platelets: 244 10*3/uL (ref 142–346)
RBC: 4.03 10*6/uL (ref 3.90–5.10)
RDW: 16 % — ABNORMAL HIGH (ref 11–15)
WBC: 9.34 10*3/uL (ref 3.10–9.50)

## 2021-05-04 LAB — HIGH SENSITIVITY TROPONIN-I WITH DELTA
hs Troponin-I Delta: 0.4 ng/L
hs Troponin-I: 35.3 ng/L — AB

## 2021-05-04 LAB — COMPREHENSIVE METABOLIC PANEL
ALT: 20 U/L (ref 0–55)
AST (SGOT): 33 U/L (ref 5–41)
Albumin/Globulin Ratio: 0.9 (ref 0.9–2.2)
Albumin: 2.5 g/dL — ABNORMAL LOW (ref 3.5–5.0)
Alkaline Phosphatase: 103 U/L (ref 37–117)
Anion Gap: 10 (ref 5.0–15.0)
BUN: 7 mg/dL (ref 7.0–21.0)
Bilirubin, Total: 0.4 mg/dL (ref 0.2–1.2)
CO2: 23 mEq/L (ref 17–29)
Calcium: 9.2 mg/dL (ref 8.5–10.5)
Chloride: 113 mEq/L — ABNORMAL HIGH (ref 99–111)
Creatinine: 0.6 mg/dL (ref 0.4–1.0)
Globulin: 2.8 g/dL (ref 2.0–3.6)
Glucose: 78 mg/dL (ref 70–100)
Potassium: 3.1 mEq/L — ABNORMAL LOW (ref 3.5–5.3)
Protein, Total: 5.3 g/dL — ABNORMAL LOW (ref 6.0–8.3)
Sodium: 146 mEq/L — ABNORMAL HIGH (ref 135–145)

## 2021-05-04 LAB — ECHOCARDIOGRAM ADULT COMPLETE W CLR/ DOPP WAVEFORM
AV Area (Cont Eq VTI): 2.4
AV Area (Cont Eq VTI): 2.416
AV Mean Gradient: 4
AV Mean Gradient: 5
AV Peak Velocity: 130
AV Peak Velocity: 134
AV Peak Velocity: 134
AV Peak Velocity: 139
IVS Diastolic Thickness (2D): 1.16
LA Dimension (2D): 4.6
LA Dimension (2D): 4.8
LA Volume Index (BP A-L): 0.025
LVID diastole (2D): 4.47
LVID systole (2D): 2.95
MV Area (PHT): 4.801
MV E/A: 1.851
MV E/A: 2
MV E/e' (Average): 12.639
Prox Ascending Aorta Diameter: 2.5
RV Systolic Pressure: 38.76
RV Systolic Pressure: 39
TAPSE: 2.22

## 2021-05-04 LAB — PROBNP: NT-proBNP: 1635 pg/mL — ABNORMAL HIGH (ref 0–125)

## 2021-05-04 LAB — ECG 12-LEAD
Atrial Rate: 85 {beats}/min
IHS MUSE NARRATIVE AND IMPRESSION: NORMAL
P Axis: 36 degrees
P-R Interval: 144 ms
Q-T Interval: 360 ms
QRS Duration: 76 ms
QTC Calculation (Bezet): 428 ms
R Axis: 50 degrees
T Axis: 26 degrees
Ventricular Rate: 85 {beats}/min

## 2021-05-04 LAB — COVID-19 (SARS-COV-2): SARS CoV 2 Overall Result: NOT DETECTED

## 2021-05-04 LAB — HIGH SENSITIVITY TROPONIN-I: hs Troponin-I: 34.9 ng/L — AB

## 2021-05-04 LAB — IHS D-DIMER: D-Dimer: 2.97 ug/mL FEU — ABNORMAL HIGH (ref 0.00–0.60)

## 2021-05-04 LAB — GFR: EGFR: >60

## 2021-05-04 MED ORDER — FUROSEMIDE 10 MG/ML IJ SOLN
20.0000 mg | Freq: Once | INTRAMUSCULAR | Status: AC
Start: 2021-05-04 — End: 2021-05-04
  Administered 2021-05-04: 20 mg via INTRAVENOUS
  Filled 2021-05-04: qty 2

## 2021-05-04 MED ORDER — POTASSIUM CHLORIDE CRYS ER 20 MEQ PO TBCR
40.0000 meq | EXTENDED_RELEASE_TABLET | Freq: Once | ORAL | Status: AC
Start: 2021-05-04 — End: 2021-05-04
  Administered 2021-05-04: 40 meq via ORAL
  Filled 2021-05-04: qty 2

## 2021-05-04 MED ORDER — SODIUM CHLORIDE 0.9 % IV BOLUS
1000.0000 mL | Freq: Once | INTRAVENOUS | Status: AC
Start: 2021-05-04 — End: 2021-05-04
  Administered 2021-05-04: 1000 mL via INTRAVENOUS

## 2021-05-04 MED ORDER — MAGNESIUM SULFATE IN D5W 1-5 GM/100ML-% IV SOLN
1.0000 g | Freq: Once | INTRAVENOUS | Status: AC
Start: 2021-05-04 — End: 2021-05-04
  Administered 2021-05-04: 1 g via INTRAVENOUS
  Filled 2021-05-04: qty 100

## 2021-05-04 MED ORDER — FUROSEMIDE 20 MG PO TABS
20.0000 mg | ORAL_TABLET | Freq: Every day | ORAL | 0 refills | Status: DC
Start: 2021-05-05 — End: 2021-05-15

## 2021-05-04 MED ORDER — LORAZEPAM 2 MG/ML IJ SOLN
1.0000 mg | Freq: Once | INTRAMUSCULAR | Status: AC
Start: 2021-05-04 — End: 2021-05-04
  Administered 2021-05-04: 1 mg via INTRAVENOUS
  Filled 2021-05-04: qty 1

## 2021-05-04 MED ORDER — IOHEXOL 350 MG/ML IV SOLN
100.0000 mL | Freq: Once | INTRAVENOUS | Status: AC | PRN
Start: 2021-05-04 — End: 2021-05-04
  Administered 2021-05-04: 100 mL via INTRAVENOUS

## 2021-05-04 NOTE — Progress Notes (Signed)
Initial Case Management Assessment and Discharge Planning  [facility]   Patient Name: JAZZ, BIDDY   Date of Birth 1985/08/27   Attending Physician: Ester Rink, DO   Primary Care Physician: Pcp, None, MD   Length of Stay 0   Reason for Consult / Chief Complaint Initial IDPA        Situation   Admission DX:   1. Hypokalemia    2. Pulmonary edema, acute    3. Hypoxia        A/O Status: X 3    LACE Score: 4    Patient admitted from: ER  Admission Status: inpatient    Health Care Agent: Self     Background     Advanced directive:   <no information>    Code Status:   Prior     Residence: Multi-story home    PCP: PCP None, MD  Patient Contact:   [phone] (home)     [phone] (mobile)     Emergency contact:   Extended Emergency Contact Information  Primary Emergency Contact: Atkinson,Keander  Mobile Phone: [phone]  Relation: Spouse  Preferred language: English  Interpreter needed? No      ADL/IADL's: Independent  Previous Level of function: 7 Independent     DME: None    Pharmacy:     [facility] - [address]  [address]  [place] [address]  Phone: [phone] Fax: [phone]    [facility] [phone] - [place] - [address]  [phone] [address]  [place] [address]  Phone: [phone] Fax: [phone]      Prescription Coverage: Yes    Home Health: The patient is not currently receiving home health services.    Previous SNF/AR: N/A    COVID Vaccine Status: Two doses; One booster    Date First IMM given:   UAI on file?: No  Transport for discharge? Mode of transportation: Sales executive - Family/Friend to drive patient  Agreeable to Home with family post-discharge:  Yes     Assessment   CM met with patient and family at bedside; verified facesheet and PCP. Pt resides with spouse in multi-level home. Pt denies having DME to ambulate or a history of SNF/HH.  Patient states that they feel safe at home and has sufficient support should  they need it.   Patient has no financial needs at this time.    Pt's spouse to provide transportation.  BARRIERS TO DISCHARGE: Medically S     Recommendation   D/C Plan A: Home with family    D/C Plan B: Home with family    D/C Plan C:        CM will coordinate care.    [place], MSW  Social Work Futures trader I  [facility]  [phone]

## 2021-05-04 NOTE — ED Triage Notes (Signed)
Heidi Espinoza is a 36 y.o. female presents to the ED for c/o SOB that started after she delivered her child on 2/21. Pt. Reports inability to regulate her breathing and she was discharged today.   BP (!) 161/93   Pulse 85   Temp 98.1 F (36.7 C) (Oral)   Resp 20   LMP 07/28/2020 (Exact Date)   SpO2 97%   Breastfeeding Yes

## 2021-05-04 NOTE — Plan of Care (Signed)
Problem: Inadequate Airway Clearance  Goal: Normal respiratory rate/effort achieved/maintained  Flowsheets (Taken 05/04/2021 1826)  Normal respiratory rate/effort achieved/maintained: Plan activities to conserve energy: plan rest periods     Problem: Pain  Goal: Pain at adequate level as identified by patient  Flowsheets (Taken 05/04/2021 1826)  Pain at adequate level as identified by patient:   Identify patient comfort function goal   Assess pain on admission, during daily assessment and/or before any "as needed" intervention(s)   Reassess pain within 30-60 minutes of any procedure/intervention, per Pain Assessment, Intervention, Reassessment (AIR) Cycle  36 year old female was admitted to room 2518A.  No distress noted.  Patient anxious, RN explained plan of care for Echo to patient and addressed all concerns and patient became less anxious.  Echo done and cardiology cleared.  Discharging home this shift.  Discharge instructions reviewed.  IV and tele removed.

## 2021-05-04 NOTE — ED Notes (Signed)
[facility] [facility] EMERGENCY DEPARTMENT  ED NURSING NOTE FOR THE RECEIVING INPATIENT NURSE   ED NURSE Vanita Panda [phone]   ED CHARGE RN [phone]   ADMISSION INFORMATION   Heidi Espinoza is a 36 y.o. female admitted with a diagnosis of:    1. Hypokalemia    2. Pulmonary edema, acute    3. Hypoxia         Isolation: None   Allergies: Penicillins and Other environmental   Holding Orders confirmed? Yes   Belongings Documented? Yes   Home medications sent to pharmacy confirmed? None   NURSING CARE   Patient Comes From:   Mental Status: Home/Family Care  alert and oriented   ADL: Independent with all ADLs   Ambulation: Pt did not ambulate in the ED   Pertinent Information  and Safety Concerns: Fall risk     COVID Test sent to lab? Yes   VITAL SIGNS   Time BP Temp Pulse Resp SpO2   0801 145/76 98.1 82 20 94 RA   CT / NIH   CT Head ordered on this patient?  No   NIH/Dysphagia assessment done prior to admission? No   PERSONAL PROTECTIVE EQUIPMENT   Gloves and Mask   LAB RESULTS   Labs Reviewed   CBC AND DIFFERENTIAL - Abnormal; Notable for the following components:       Result Value    Hgb 9.8 (*)     Hematocrit 31.3 (*)     MCV 77.7 (*)     MCH 24.3 (*)     MCHC 31.3 (*)     RDW 16 (*)     Instrument Absolute Neutrophil Count 7.27 (*)     Neutrophils Absolute 7.27 (*)     All other components within normal limits   COMPREHENSIVE METABOLIC PANEL - Abnormal; Notable for the following components:    Sodium 146 (*)     Potassium 3.1 (*)     Chloride 113 (*)     Protein, Total 5.3 (*)     Albumin 2.5 (*)     All other components within normal limits   IHS D-DIMER - Abnormal; Notable for the following components:    D-Dimer 2.97 (*)     All other components within normal limits   PROBNP - Abnormal; Notable for the following components:    NT-proBNP 1,635 (*)     All other components within normal limits   HIGH SENSITIVITY TROPONIN-I - Abnormal; Notable for the following components:    hs Troponin-I 34.9 (*)     All other components  within normal limits   COVID-19 (SARS-COV-2)    Narrative:     o Collect and clearly label specimen type:  o PREFERRED-Upper respiratory specimen: One Nasal Swab in  [employer].  o Hand deliver to laboratory ASAP  Indication for testing->Extended care facility admission to  semi private room  Screening   GFR   HIGH SENSITIVITY TROPONIN-I WITH DELTA          Ticket to Ride Printed: Yes

## 2021-05-04 NOTE — ED Provider Notes (Addendum)
Chief Complaint   Patient presents with    Shortness of Breath    Postpartum Complications       HISTORY OF PRESENT ILLNESS:     Heidi Espinoza is a 36 y.o. female presenting to the ED with shortness of breath that initially onset shortly after she delivered her child on 2/21.  After going home this continued and she states that she has been having trouble catching her breath.  She was discharged earlier today.  She does have a history of generalized anxiety disorder, gestational hypertension but states she was not treated with anything for that.    I reviewed patient's last ED visit, clinic visit or admission/discharge summary, as well as associated recent EKGs, lab or imaging results, if applicable.     PAST MEDICAL HISTORY:     Patient Active Problem List   Diagnosis    Generalized anxiety disorder    AMA (advanced maternal age) multigravida 35+    Thalassemia alpha carrier    High-risk pregnancy in third trimester    Hypertension affecting pregnancy in third trimester    Gestational hypertension affecting first pregnancy    SVD (spontaneous vaginal delivery)    Term birth of newborn female    Prolonged second stage of labor due to poor maternal effort    Pulmonary edema, acute       Past Medical History:   Diagnosis Date    Anxiety     Disorder of thyroid     Hypertension     Vaginal delivery 05/02/2021       Past Surgical History:   Procedure Laterality Date    THYROID CYST EXCISION Right     in 3rd grade, path = benign    WISDOM TOOTH EXTRACTION Bilateral 09/2015       Allergies   Allergen Reactions    Penicillins Hives, Other (See Comments) and Rash     Pt unsure; reaction as child       Other Environmental      seasonal       Home Medications       Med List Status: In Progress Set By: Sid Falcon, RN at 05/04/2021  3:07 AM              acetaminophen (TYLENOL) 325 MG tablet     Take 2 tablets (650 mg) by mouth every 6 (six) hours as needed for Pain     Prenatal MV & Min w/FA-DHA (Prenatal Adult  Gummy/DHA/FA) 0.4-25 MG Chew Tab     Chew by mouth          Flagged for Removal               ibuprofen (ADVIL) 600 MG tablet     Take 1 tablet (600 mg) by mouth every 6 (six) hours as needed for Pain     Misc. Devices (Breast Pump) Misc     Dispense 1 double electric breast pump for breastfeeding mother for use for 12 months.            Family History   Problem Relation Age of Onset    Stroke Mother     Hyperlipidemia Mother     Hypertension Mother     Bell's palsy Mother     Hyperlipidemia Father     Diabetes Father     Sarcoidosis Father        SOCIAL HISTORY:  Social History     Tobacco Use    Smoking status: Never  Smokeless tobacco: Never   Substance Use Topics    Alcohol use: Not Currently     Comment: socially; not during pregnancy     Social History     Social History Narrative    Not on file       Medical and social history reviewed and as noted above. No additional to report at this time.     REVIEW OF SYSTEMS:  As per HPI.  All other systems reviewed and negative unless otherwise noted above.    PHYSICAL EXAM:     ED Triage Vitals   Enc Vitals Group      BP 05/04/21 0243 (!) 161/93      Heart Rate 05/04/21 0243 85      Resp Rate 05/04/21 0243 20      Temp 05/04/21 0243 98.1 F (36.7 C)      Temp Source 05/04/21 0243 Oral      SpO2 05/04/21 0243 97 %      Weight 05/04/21 0246 96.1 kg      Height --       Head Circumference --       Peak Flow --       Pain Score --       Pain Loc --       Pain Edu? --       Excl. in GC? --        Constitutional: Well-appearing,  Alert. NAD  Skin:  Warm, dry.  No rashes noted  Head:  Normocephalic. Atraumatic   Neck:  Supple. Normal ROM  Eyes: EOMI, no scleral icterus  Ears, nose, mouth and throat:  Oral mucosa moist. Nose normal  Cardiovascular:  Normal peripheral perfusion.  RRR, no murmurs noted  Respiratory:  respirations are non-labored. Lungs CTA bilaterally, no wheezing   Gastrointestinal:  Soft, Nontender, nondistended. No rebound/guarding  Neurological:  Alert  and oriented. No focal neurological deficits  Psychiatric:  Cooperative. Normal affect. Normal speech  Musculoskeletal: no obvious deformity. No cyanosis      ED STUDIES:     Labs Reviewed   CBC AND DIFFERENTIAL - Abnormal; Notable for the following components:       Result Value    Hgb 9.8 (*)     Hematocrit 31.3 (*)     MCV 77.7 (*)     MCH 24.3 (*)     MCHC 31.3 (*)     RDW 16 (*)     Instrument Absolute Neutrophil Count 7.27 (*)     Neutrophils Absolute 7.27 (*)     All other components within normal limits   COMPREHENSIVE METABOLIC PANEL - Abnormal; Notable for the following components:    Sodium 146 (*)     Potassium 3.1 (*)     Chloride 113 (*)     Protein, Total 5.3 (*)     Albumin 2.5 (*)     All other components within normal limits   IHS D-DIMER - Abnormal; Notable for the following components:    D-Dimer 2.97 (*)     All other components within normal limits   PROBNP - Abnormal; Notable for the following components:    NT-proBNP 1,635 (*)     All other components within normal limits   GFR   HIGH SENSITIVITY TROPONIN-I   HIGH SENSITIVITY TROPONIN-I WITH DELTA       CT Angiogram Chest   Final Result         1. No pulmonary embolism.  2. Bilateral pleural effusions with bilateral groundglass opacities and   interstitial thickening favoring edema.      Jorene Guest, MD    05/04/2021 6:31 AM            MDM:    This is a 36 year old female presenting to the emergency department as per HPI.  On evaluation the patient is well-appearing in no acute distress, she is very mildly tachypneic but her vital signs are stable.  Discussed with her my differentials including PE in her postpartum state versus less likely pneumonia, pneumothorax or ACS.  She is amenable to lab work including a D-dimer with plan to proceed with CT if that is elevated as can be expected after pregnancy.    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  No increased work of breathing noted.  She is agreeable to admission.  I have consulted cardiology and the hospitalist, patient will be admitted to sound Dr. Francisco Capuchin.  [RS]   [id number] NT-proBNP(!): 1,635 [RS]   0740 Started on lasix, low dose as patient is lasix naive [RS]      ED Course User Index  [RS] Loman Brooklyn, DO       Medical Decision Making Checklist:     History obtained from patient      Presentation/Chief Complaint (1-2 line summary): This is a 36 y.o. year old female presenting to the ED for   Chief Complaint   Patient presents with    Shortness of Breath    Postpartum Complications        Chronic conditions affecting care:     Past Medical History:   Diagnosis Date    Anxiety     Disorder of thyroid     Hypertension     Vaginal delivery 05/02/2021        Social Determinants of Health  Social Determinants of Health     Tobacco Use: Low Risk     Smoking Tobacco Use: Never    Smokeless Tobacco Use: Never    Passive Exposure: Not on file   Alcohol Use: Not on file   Financial Resource Strain: Not on file   Food Insecurity: Not on file   Transportation Needs: Not on file   Physical Activity: Not on file   Stress: Not on file   Social Connections: Not on file   Intimate Partner Violence: Not on file   Depression: Not on file   Postpartum Depression: Not on file       Pertinent Physical Exam findings/reassessments: see PE for details    Vitals Reviewed (and how addressed): Yes.   Vitals:    05/04/21 0506 05/04/21 0622 05/04/21 0632 05/04/21 0702   BP: 132/72 137/74 136/79 130/80   Pulse: 83 94 84 89   Resp: (!) 28 (!) 37 (!) 34 (!) 26   Temp:       TempSrc:       SpO2: 94% (!) 88% (!) 87% (!) 89%   Weight:         Pulse Oximetry Analysis: 96% on room air  Cardiac Monitor:  Rhythm:  RRR, no ectopy      Diagnosis Descriptors: Acute shortness of breath    Final  Diagnoses (include descriptors: moderate/severe, acute/chronic, complicated/uncomplicated, exacerbation, distress, stable/unstable, etc):   Addressed Diagnoses:   1. Hypokalemia    2. Pulmonary edema, acute    3. Hypoxia       Ruled out Diagnoses: STEMI, PE    Critical Care: Yes   CRITICAL CARE: The high probability of sudden, clinically significant deterioration in the patient's condition required the highest level of my preparedness to intervene urgently.    The services I provided to this patient were to treat and/or prevent clinically significant deterioration that could result in: death.  Services included the following: chart data review, reviewing nursing notes and/or old charts, documentation time, consultant collaboration regarding findings and treatment options, medication orders and management, direct patient care, re-evaluations, vital sign assessments and ordering, interpreting and reviewing diagnostic studies/lab tests.    Aggregate critical care time was 55 minutes, which includes only time during which I was engaged in work directly related to the patient's care, as described above, whether at the bedside or elsewhere in the Emergency Department.  It did not include time spent performing other reported procedures or the services of residents, students, nurses or physician assistants.      Data Interpretation:    EKG:  Interpreted by me, the Emergency Physician.  Sinus rhythm, rate 85, normal axis, no interval abnormalities or acute signs of ischemia  Labs: All labs have been personally reviewed and interpreted by me. Conclusions and how affected treatment: see ED course above for details    Xrays: Reviewed and interpreted by me, confirmed by radiology report. See ED course for details    CT/US/MRI: Reviewed  by me, confirmed by radiology report. See ED course for details    Consult Summaries:   ED Consult(s): Yes.   I have discussed this case with Dr. Francisco Capuchin, [facility] , patient admitted as  inpatient.      Name and Route of medications   Given in the ED (and reassessment):  with improvement.       Medications   furosemide (LASIX) injection 20 mg (has no administration in time range)   magnesium sulfate 1g in dextrose 5% IVPB (premix) (1 g Intravenous New Bag 05/04/21 0504)   sodium chloride 0.9 % bolus 1,000 mL (1,000 mLs Intravenous New Bag 05/04/21 0503)   iohexol (OMNIPAQUE) 350 MG/ML injection 100 mL (100 mLs Intravenous Imaging Agent Given 05/04/21 0559)   potassium chloride (KLOR-CON M20) CR tablet 40 mEq (40 mEq Oral Given 05/04/21 0713)   LORazepam (ATIVAN) injection 1 mg (1 mg Intravenous Given 05/04/21 0711)       Disposition: Admit        **Please note, this document was generated using voice recognition software which was designed to generate medical notes and help improve efficiency in patient care.  Unfortunately, the software does generate grammatical errors and typos.  While this note was reviewed and edited where appropriate, its possible that some of these errors may still be present.            Loman Brooklyn, DO  05/04/21 5409       Loman Brooklyn, DO  05/04/21 0740

## 2021-05-04 NOTE — Consults (Signed)
[facility] C[facility] CONSULTATION REPORT  [facility]        Date Time: 05/04/21 8:50 AM  Patient Name: Heidi Espinoza  Requesting Physician: Ester Rink, DO  Consulting Cardiologist:  Andree Coss, DO  MRN:  [id number]  CSN:   [id number]  Date of Admission:  05/04/2021       Reason for Consultation:     Shortness of breath    History:   Heidi Espinoza is a 36 y.o. female admitted on 05/04/2021.  We have been asked by Ester Rink, DO,  to provide cardiac consultation, regarding shortness of breath.    36 year old female with past medical history of recent vaginal delivery (2/21) coming in with progressive symptoms of shortness of breath.  Patient delivered vaginally 2 days ago.  She was discharged yesterday feeling relatively okay.  When she got home she noticed that she was more short of breath.  Specifically when she tried to lay flat she had difficult time breathing and thought she was having a panic attack.  Patient came back to the hospital for evaluation.  She was found to be hypoxic to 88 to 90% on room air.  Her BNP is elevated and her high-sensitivity troponin is also mildly elevated.  EKG shows normal sinus rhythm.  Patient denies chest pain at rest or with exertion.  She has a family history of pulmonary and cutaneous sarcoidosis.  She has no prior smoking history.  She does note swelling in her hands and legs however she just did give birth.  Patient denies fever, night sweats and chills.    Past Medical History:     Past Medical History:   Diagnosis Date    Anxiety     Disorder of thyroid     Hypertension     Vaginal delivery 05/02/2021       Past Surgical History:     Past Surgical History:   Procedure Laterality Date    THYROID CYST EXCISION Right     in 3rd grade, path = benign    WISDOM TOOTH EXTRACTION Bilateral 09/2015       Family History:     Family History   Problem Relation Age of Onset    Stroke Mother     Hyperlipidemia Mother     Hypertension Mother      Bell's palsy Mother     Hyperlipidemia Father     Diabetes Father     Sarcoidosis Father        Social History:     Social History     Socioeconomic History    Marital status: Married     Spouse name: Not on file    Number of children: Not on file    Years of education: Not on file    Highest education level: Not on file   Occupational History    Not on file   Tobacco Use    Smoking status: Never    Smokeless tobacco: Never   Vaping Use    Vaping Use: Never used   Substance and Sexual Activity    Alcohol use: Not Currently     Comment: socially; not during pregnancy    Drug use: Never    Sexual activity: Yes     Partners: Male     Birth control/protection: None   Other Topics Concern    Not on file   Social History Narrative    Not on file     Social Determinants of Health  Financial Resource Strain: Not on file   Food Insecurity: Not on file   Transportation Needs: Not on file   Physical Activity: Not on file   Stress: Not on file   Social Connections: Not on file   Intimate Partner Violence: Not on file       Allergies:     Allergies   Allergen Reactions    Penicillins Hives, Other (See Comments) and Rash     Pt unsure; reaction as child       Other Environmental      seasonal       Medications:   (Not in a hospital admission)           Review of Systems:    Comprehensive review of systems including constitutional, eyes, ears, nose, mouth, throat, cardiovascular, GI, GU, musculoskeletal, integumentary, respiratory, neurologic, psychiatric, and endocrine is negative other than what is mentioned already in the history of present illness    Physical Exam:     VITAL SIGNS PHYSICAL EXAM   Vitals:    05/04/21 0801   BP: 145/76   Pulse: 82   Resp: (!) 23   Temp:    SpO2: 94%     Temp (24hrs), Avg:98.1 F (36.7 C), Min:98.1 F (36.7 C), Max:98.1 F (36.7 C)      Intake and Output Summary (Last 24 hours) at Date Time  No intake or output data in the 24 hours ending 05/04/21 0850    Telemetry: no changes Physical  Exam  General: awake, alert, breathing comfortably, no acute distress  Head: normocephalic  Eyes: Lids & conjunctiva normal  Cardiovascular: regular rate and rhythm, normal S1, S2, no S3, no S4, no murmurs, rubs or gallops  Neck: No JVD;  No Carotid bruit  Lungs: Crackles at the bases bilaterally  Abdomen: soft, non-tender, no HJR  Extremities: 1+ pitting edema bilateral lower extremities and upper extremities  Pulse: 2+ equal bilaterally   Neurological: No gross motor defect  Psychiatric: Alert and oriented X3, mood and affect normal  Musculoskeletal: normal strength and tone     Labs Reviewed:     Recent Labs   Lab 05/04/21  0718   hs Troponin-I 34.9*             Recent Labs   Lab 05/04/21  0437   Bilirubin, Total 0.4   Protein, Total 5.3*   Albumin 2.5*   ALT 20   AST (SGOT) 33             Recent Labs   Lab 05/04/21  0437 05/03/21  1027 05/01/21  1211   WBC 9.34 8.19 8.26   Hgb 9.8* 9.8* 11.2*   Hematocrit 31.3* 31.1* 35.7   Platelets 244 212 225     Recent Labs   Lab 05/04/21  0437 04/28/21  0930   Sodium 146* 142   Potassium 3.1* 3.2*   Chloride 113* 106   CO2 23 23   BUN 7.0 5.0*   Creatinine 0.6 0.6   EGFR >60.0 >60.0   Glucose 78 75   Calcium 9.2 9.3       DIAGNOSTIC    EKG: Normal sinus rhythm    chest X-ray    Assessment:   Shortness of breath  Concern for volume overload/CHF  Recent vaginal delivery    Recommendations:     36 year old female with past medical history of recent vaginal delivery (2/21) coming in with progressive symptoms of shortness of breath.  Patient has crackles,  bilateral pleural effusions and elevated BNP consistent with volume overload.  She did receive 1 L normal saline in the emergency room before getting 20 mg of IV Lasix.  Would continue with Lasix twice daily with an additional dose of 20 mg IV Lasix at 12 PM.  We will get an echocardiogram this morning to evaluate for postpartum cardiomyopathy.  We will hold off on afterload reduction given she is immediately postpartum to see  if Lasix can work by itself.  If she has reduced EF then we will have to have a different discussion.  Case discussed with primary team.        Signed by: Andree Coss, DO    [facility]  MD Spectralink [phone] or [phone] (8am-5pm)  Office/Pager: [phone]  NP Spectralink [phone]   After hours, non urgent consult line [phone]  After Hours, urgent consults [phone]

## 2021-05-04 NOTE — ED Notes (Signed)
This RN transported pt via wheelchair to NICU for pt to see baby.

## 2021-05-04 NOTE — ED Notes (Signed)
Pt c/o new right lower extremity pain/swelling. Pain is located more on the posterior area of the ankle than calf. Swelling noted to BLE. MD notified.

## 2021-05-05 ENCOUNTER — Encounter (HOSPITAL_BASED_OUTPATIENT_CLINIC_OR_DEPARTMENT_OTHER): Payer: Self-pay

## 2021-05-06 LAB — ECG 12-LEAD
Atrial Rate: 96 {beats}/min
IHS MUSE NARRATIVE AND IMPRESSION: NORMAL
P Axis: 44 degrees
P-R Interval: 154 ms
Q-T Interval: 340 ms
QRS Duration: 66 ms
QTC Calculation (Bezet): 429 ms
R Axis: 43 degrees
T Axis: 32 degrees
Ventricular Rate: 96 {beats}/min

## 2021-05-06 NOTE — Discharge Summary (Signed)
[facility]      Patient: Heidi Espinoza  Admission Date: 05/04/2021   DOB: 1985-05-28  Discharge Date: 05/04/2021   MRN: [id number]  Discharge Attending:Dalayla Aldredge A Citlalli Weikel, DO     Referring Physician: Pcp, None, MD  PCP: Pcp, None, MD       DISCHARGE SUMMARY     Discharge Information   Admission Diagnosis:   Pulmonary edema, acute    Discharge Diagnosis:   Active Hospital Problems    Diagnosis    Pulmonary edema, acute    Shortness of breath  Concern for volume overload/CHF  Recent vaginal delivery            Hospital Course   36 year old female with past medical history of recent vaginal delivery (2/21) coming in with progressive symptoms of shortness of breath.  Patient has crackles, bilateral pleural effusions and elevated BNP consistent with volume overload. Cardiology consulted. Echocardiogram this morning to evaluate for postpartum cardiomyopathy. EF 60%. Cards recommended lasix for 1 week will follow up.       Best Practices   Was the patient admitted with either a CHF Exacerbation or Pneumonia? yes     Progress Note/Physical Exam at Discharge     Subjective: feeling well    Vitals:    05/04/21 1311 05/04/21 1356 05/04/21 1420 05/04/21 1644   BP: 152/71 113/71  133/79   Pulse: 68 73  71   Resp: 20 18  18    Temp:  98.4 F (36.9 C)  98.4 F (36.9 C)   TempSrc:  Oral  Oral   SpO2: 96% 96%  93%   Weight:  93.5 kg (206 lb 3.2 oz) 93.4 kg (206 lb)    Height:   1.727 m (5\' 8" )        General: NAD, AAOx3  HEENT: perrla, eomi, sclera anicteric, OP: Clear, MMM  Neck: supple, FROM, no LAD  Cardiovascular: RRR, no m/r/g  Lungs: CTAB, no w/r/r  Abdomen: soft, +BS, NT/ND, no masses, no g/r  Extremities: no C/C/E  Skin: no rashes or lesions noted  Neuro: CN 2-12 intact; No Focal neurological deficits       Diagnostics     Labs/Studies Pending at Discharge: No    Last Labs   Recent Labs   Lab 05/04/21  0437 05/03/21  1027 05/01/21  1211   WBC 9.34 8.19 8.26   RBC 4.03 4.08 4.61   Hgb 9.8* 9.8* 11.2*   Hematocrit 31.3*  31.1* 35.7   MCV 77.7* 76.2* 77.4*   Platelets 244 212 225       Recent Labs   Lab 05/04/21  0437   Sodium 146*   Potassium 3.1*   Chloride 113*   CO2 23   BUN 7.0   Creatinine 0.6   Glucose 78   Calcium 9.2       Microbiology Results (last 15 days)       Procedure Component Value Units Date/Time    COVID-19 (SARS-CoV-2) only (Liat Rapid) asymptomatic admission [[id number]] Collected: 05/04/21 0804    Order Status: Completed Specimen: Nasopharyngeal Updated: 05/04/21 0843     Purpose of COVID testing Screening     SARS-CoV-2 Specimen Source Nasal Swab     SARS CoV 2 Overall Result Not Detected     Comment: __________________________________________________  -A result of "Detected" indicates POSITIVE for the    presence of SARS CoV-2 RNA  -A result of "Not Detected" indicates NEGATIVE for the    presence of SARS CoV-2 RNA  __________________________________________________________  Test performed using the Roche cobas Liat SARS-CoV-2 assay. This assay is  only for use under the Food and Drug Administration's Emergency Use  Authorization. This is a real-time RT-PCR assay for the qualitative  detection of SARS-CoV-2 RNA. Viral nucleic acids may persist in vivo,  independent of viability. Detection of viral nucleic acid does not imply the  presence of infectious virus, or that virus nucleic acid is the cause of  clinical symptoms. Negative results do not preclude SARS-CoV-2 infection and  should not be used as the sole basis for diagnosis, treatment or other  patient management decisions. Negative results must be combined with  clinical observations, patient history, and/or epidemiological information.  Invalid results may be due to inhibiting substances in the specimen and  recollection should occur. Please see Fact Sheets for patients and providers  located:  WirelessDSLBlog.no         Narrative:      o Collect and clearly label specimen type:  o PREFERRED-Upper respiratory specimen: One  Nasal Swab in  [employer].  o Hand deliver to laboratory ASAP  Indication for testing->Extended care facility admission to  semi private room  Screening             Patient Instructions       Follow Up Appointment:   Follow-up Information       [facility] Follow up.    Specialty: Family Medicine  Contact information:  [address]  [address]  [place] [address]  [phone]  Additional information:  From [address] - take [address]. Go west on [address]. Go through the intersection with [address]. Destination is on your left.      From [address] - go east on [address] past [employer]. Destination is on your right.             Vlacancich, Ruben Reason, DO. Call.    Specialties: Cardiology, Internal Medicine  Contact information:  [address]  [address] [place] [address]  [phone]               Pcp, None, MD .                              Discharge Medications:     Medication List        START taking these medications      furosemide 20 MG tablet  Commonly known as: LASIX  Take 1 tablet (20 mg) by mouth daily            CONTINUE taking these medications      acetaminophen 325 MG tablet  Commonly known as: TYLENOL  Take 2 tablets (650 mg) by mouth every 6 (six) hours as needed for Pain     Prenatal Adult Gummy/DHA/FA 0.4-25 MG Chew            STOP taking these medications      Breast Pump Misc     ibuprofen 600 MG tablet  Commonly known as: ADVIL               Where to Get Your Medications        These medications were sent to [facility], [place] - [address]  [address] [place] [address]      Phone: [phone]   furosemide 20 MG tablet  Time spent examining patient, discussing with patient/family regarding hospital course, chart review, reconciling medications and discharge planning: 35 minutes.    Signed,  Ester Rink, DO    1:42 PM 05/06/2021

## 2021-05-08 LAB — LAB USE ONLY - HISTORICAL SURGICAL PATHOLOGY

## 2021-05-09 ENCOUNTER — Other Ambulatory Visit: Payer: Self-pay | Admitting: Obstetrics & Gynecology

## 2021-05-09 ENCOUNTER — Encounter: Payer: Self-pay | Admitting: Obstetrics & Gynecology

## 2021-05-09 ENCOUNTER — Telehealth (HOSPITAL_BASED_OUTPATIENT_CLINIC_OR_DEPARTMENT_OTHER): Payer: Self-pay

## 2021-05-09 ENCOUNTER — Encounter (HOSPITAL_BASED_OUTPATIENT_CLINIC_OR_DEPARTMENT_OTHER): Payer: Self-pay | Admitting: Obstetrics & Gynecology

## 2021-05-09 ENCOUNTER — Ambulatory Visit (INDEPENDENT_AMBULATORY_CARE_PROVIDER_SITE_OTHER): Admitting: Obstetrics & Gynecology

## 2021-05-09 VITALS — BP 181/108 | HR 92 | Temp 97.8°F | Wt 188.8 lb

## 2021-05-09 DIAGNOSIS — Z9189 Other specified personal risk factors, not elsewhere classified: Secondary | ICD-10-CM

## 2021-05-09 DIAGNOSIS — F419 Anxiety disorder, unspecified: Secondary | ICD-10-CM

## 2021-05-09 DIAGNOSIS — O165 Unspecified maternal hypertension, complicating the puerperium: Secondary | ICD-10-CM

## 2021-05-09 DIAGNOSIS — J810 Acute pulmonary edema: Secondary | ICD-10-CM

## 2021-05-09 LAB — PROTEIN / CREATININE RATIO, URINE
Urine Creatinine, Random: 12 mg/dL
Urine Protein Random: <7 mg/dL (ref 1.0–14.0)

## 2021-05-09 LAB — COMPREHENSIVE METABOLIC PANEL
ALT: 24 U/L (ref 0–55)
AST (SGOT): 36 U/L (ref 5–41)
Albumin/Globulin Ratio: 1 (ref 0.9–2.2)
Albumin: 3.1 g/dL — ABNORMAL LOW (ref 3.5–5.0)
Alkaline Phosphatase: 100 U/L (ref 37–117)
Anion Gap: 13 (ref 5.0–15.0)
BUN: 9 mg/dL (ref 7.0–21.0)
Bilirubin, Total: 0.7 mg/dL (ref 0.2–1.2)
CO2: 26 mEq/L (ref 17–29)
Calcium: 8.7 mg/dL (ref 8.5–10.5)
Chloride: 106 mEq/L (ref 99–111)
Creatinine: 0.7 mg/dL (ref 0.4–1.0)
Globulin: 3.2 g/dL (ref 2.0–3.6)
Glucose: 81 mg/dL (ref 70–100)
Potassium: 3.2 mEq/L — ABNORMAL LOW (ref 3.5–5.3)
Protein, Total: 6.3 g/dL (ref 6.0–8.3)
Sodium: 145 mEq/L (ref 135–145)

## 2021-05-09 LAB — CBC
Absolute NRBC: 0 10*3/uL (ref 0.00–0.00)
Hematocrit: 36.7 % (ref 34.7–43.7)
Hgb: 11.2 g/dL — ABNORMAL LOW (ref 11.4–14.8)
MCH: 24 pg — ABNORMAL LOW (ref 25.1–33.5)
MCHC: 30.5 g/dL — ABNORMAL LOW (ref 31.5–35.8)
MCV: 78.8 fL (ref 78.0–96.0)
MPV: 12.1 fL (ref 8.9–12.5)
Nucleated RBC: 0 /100 WBC (ref 0.0–0.0)
Platelets: 392 10*3/uL — ABNORMAL HIGH (ref 142–346)
RBC: 4.66 10*6/uL (ref 3.90–5.10)
RDW: 16 % — ABNORMAL HIGH (ref 11–15)
WBC: 7.49 10*3/uL (ref 3.10–9.50)

## 2021-05-09 LAB — URIC ACID: Uric acid: 6.6 mg/dL (ref 2.6–7.1)

## 2021-05-09 LAB — GFR: EGFR: >60

## 2021-05-09 LAB — HEMOLYSIS INDEX: Hemolysis Index: 14 Index (ref 0–24)

## 2021-05-09 LAB — POCT URINALYSIS DIPSTIX (10)(MULTI-TEST)
Bilirubin, UA POCT: NEGATIVE
Glucose, UA POCT: NEGATIVE mg/dL
Ketones, UA POCT: NEGATIVE mg/dL
Nitrite, UA POCT: NEGATIVE
POCT Leukocytes, UA: NEGATIVE
POCT Spec Gravity, UA: 1.025 (ref 1.001–1.035)
POCT pH, UA: 7 (ref 5–8)
Protein, UA POCT: NEGATIVE mg/dL
Urobilinogen, UA: 0.2 mg/dL

## 2021-05-09 LAB — LACTATE DEHYDROGENASE: LDH: 401 U/L — ABNORMAL HIGH (ref 120–331)

## 2021-05-09 MED ORDER — LABETALOL HCL 200 MG PO TABS
200.0000 mg | ORAL_TABLET | Freq: Two times a day (BID) | ORAL | 1 refills | Status: DC
Start: 2021-05-09 — End: 2021-06-13

## 2021-05-09 MED ORDER — CLONAZEPAM 0.5 MG PO TABS
0.5000 mg | ORAL_TABLET | Freq: Two times a day (BID) | ORAL | 0 refills | Status: DC | PRN
Start: 2021-05-09 — End: 2021-06-13

## 2021-05-09 MED ORDER — LABETALOL HCL 200 MG PO TABS
200.0000 mg | ORAL_TABLET | Freq: Two times a day (BID) | ORAL | 1 refills | Status: DC
Start: 2021-05-09 — End: 2021-05-09

## 2021-05-09 MED ORDER — ESCITALOPRAM OXALATE 10 MG PO TABS
10.0000 mg | ORAL_TABLET | Freq: Every day | ORAL | 0 refills | Status: AC
Start: 2021-05-09 — End: ?

## 2021-05-09 NOTE — Telephone Encounter (Signed)
3pm- Contacted pt to check in with her following OV this AM for blood pressures in which it was recommended for her to go to hospital, pt declined. Pt was instructed by Dr. Mayford Knife to take 2 BP's at home, to let us know of those readings. When speaking with pt, she stated she had not done as instructed and was advised to take BP's ASAP.     4:20pm- Attempted to reach pt to f/u on previous conversation, unable to reach pt at this time. Dr. Mayford Knife notified of call attempts.

## 2021-05-09 NOTE — Progress Notes (Signed)
Duplicate

## 2021-05-09 NOTE — Progress Notes (Signed)
LM for pt to call back and or send me her bp checks and inform us if she was able to pick up her rx, and if her bp are still elevated to go to the ER for further evaluation and treatment.

## 2021-05-09 NOTE — Progress Notes (Addendum)
POST-PARTUM BLOOD PRESSURE CHECK     HPI:  Heidi Espinoza is here for a post-partum BP check. She is . She is 1 weeks s/p method of delivery: Normal spontaneous vaginal delivery. Complicated by: prolonged 2nd stage with NICU admission of baby - short stay (see my prior notes for details). Patient was induced due to Gestational HTN although her BP's had normalized mostly at time of delivery (noted to have a significant anxiety component to BP elevation w/ white coat HTN which is situational).     She was discharged on PPD#1 per request but was later readmitted on PPD#2 with SOB, swelling and normal BP's. She was noted to have acute pulmonary edema but ruled out for STEMI, PE and cardiomyopathy. She was started on Lasix and had Cardiology consultation with normal echo. She continues w/ Lasix PO and has several more days. She is doing well currently. She has not been checking BP's at home b/c she reports this "stresses me out."  She has a significant hx of anxiety and was previously on Lexapro several years ago. She does not currently have therapist or psychiatrist. She is accompanied by husband and infant.    Denies severe HA's (+mild HA's), denies visual changes, denies RUQ/epigastric pain.   Endorses SOB only w/ exertion, reports swelling which is improving.   Denies CP, N/V, F/C    ER records:  Troponins: elevated, BNP: elevated  EKG wnl, Echo wnl EF 60%  Potassium low (repleted)  LFT's wnl  CTPA negative for PE    Allergies   Allergen Reactions    Penicillins Hives, Other (See Comments) and Rash     Pt unsure; reaction as child       Other Environmental      seasonal     OB History   Gravida Para Term Preterm AB Living   2 1 1  0 1 1   SAB IAB Ectopic Multiple Live Births     1 0 0 1      # Outcome Date GA Lbr Len/2nd Weight Sex Delivery Anes PTL Lv   2 Term 05/02/21 101w5d / 05:36 7 lb 9 oz (3.43 kg) F Vag-Spont EPI N LIV   1 IAB              Review of Systems - See HPI    Vitals:    05/09/21 1122 05/09/21 1135  05/09/21 1230   BP: (!) 177/102 (!) 177/99 (!) 181/108   Pulse: 84 92    Temp: 97.8 F (36.6 C)     Weight: 188 lb 12.8 oz (85.6 kg)       General appearance - alert & oriented, well appearing, no distress  Mood: tearful when discussing BP's with anticipation of repeat vitals  Heart: regular rate  Pulmonary: unlabored breathing, no accessory use  Extremities: 1+ BLE edema    Assessment: Post-delivery perineal laceration repair check. Healing well.  1. Postpartum hypertension  Protein, urine, 24 hour    Protein / creatinine ratio, urine    POCT UA Dipstix (10)(Multi-Test)    Comprehensive metabolic panel    CBC without differential    Lactate dehydrogenase    Uric acid      2. Pulmonary edema, acute        3. Anxiety  Ambulatory referral to Behavioral Health      4. At risk for postpartum depression  Ambulatory referral to Behavioral Health        1) Elevated blood pressures with significant anxiety  component; serial BP's taken in office despite trying to relax; has not been checking BP's at home. Given severity of BP's, concern for PP PEC and need for IV anti-HTN medication  2) Recent short hospitalization post-partum for acute pulmonary edema, tx'd w/ lasix and ruled out for cardiomyopathy.     Plan:  - Recommend evaluation of BP's and treatment at hospital; patient is reluctant to go to hospital.   - Advised patient to try to relax and calm nerves and reassess BP's; discussed my concern re: possibility of post-partum PEC. OB triage RN to follow-up with patient; she verbalized her understanding.   - Urine dip is negative for protein, sent for stat protein/creatinine ratio, stat PIH panel and given collection for 24-hour urine.   - Initiate Labetal 100 mg BID, script sent  - Given BP log w/ parameters and when to call/come in for further evaluation  - Advised f/u with Cardiology w/in 1 week and continue Lasix until completed.    - Referral given for mental health services: recommend both psychiatry evaluation and  therapy  - Will initiate Lexapro 10 mg daily, low-dose Clonazepam 0.5 prn (unable to send electronically to [facility], paper script provided, may have to send to [facility] pharmacy).     - Follow-up: 1-2 weeks for BP check and prn    Shireen Quan. Mayford Knife, MD, [facility]  [facility]

## 2021-05-09 NOTE — Patient Instructions (Signed)
Start:  Vitron C - daily (iron supplement), take a stool softener, plenty fiber and fluids/water    Labetalol 100 mg twice daily.

## 2021-05-09 NOTE — Progress Notes (Signed)
Patient called answering service due to issues with getting her labetalol filled.   She was seen in the office today for a 2 week postpartum visit and was noted to have severe range BPs with negative protein on dip. She was advised to come to the hospital but pt declined. She was given rx for labetalol but her pharmacy closed.   She denies any clinical signs of preE. She re-checked her BP at home and reports her most recent BP was 153/86.  Rx for labetalol sent to [facility].   PreE labs pending.  Patient encouraged to monitor her BP 1-2 times a day and call for any severe range Bps.   All questions answered.

## 2021-05-15 ENCOUNTER — Other Ambulatory Visit

## 2021-05-15 ENCOUNTER — Encounter (INDEPENDENT_AMBULATORY_CARE_PROVIDER_SITE_OTHER): Payer: Self-pay | Admitting: Nurse Practitioner

## 2021-05-15 ENCOUNTER — Ambulatory Visit (INDEPENDENT_AMBULATORY_CARE_PROVIDER_SITE_OTHER): Admitting: Nurse Practitioner

## 2021-05-15 VITALS — BP 148/78 | HR 67 | Ht 67.0 in | Wt 172.0 lb

## 2021-05-15 DIAGNOSIS — F411 Generalized anxiety disorder: Secondary | ICD-10-CM

## 2021-05-15 DIAGNOSIS — O163 Unspecified maternal hypertension, third trimester: Secondary | ICD-10-CM

## 2021-05-15 DIAGNOSIS — J810 Acute pulmonary edema: Secondary | ICD-10-CM

## 2021-05-15 DIAGNOSIS — R0602 Shortness of breath: Secondary | ICD-10-CM

## 2021-05-15 NOTE — Progress Notes (Signed)
[facility] OFF[facility] PROGRESS NOTE    HRT [facility] OFFICE -CARDIOLOGY  [address]  [place] [address]  Dept: [phone]  Dept Fax: [phone]       Patient Name: Heidi Espinoza    Date of Visit:  May 15, 2021  Date of Birth: 1985/08/11  AGE: 36 y.o.  Medical Record #: [id number]  Requesting Physician: Heidi None, MD      CHIEF COMPLAINT: Hospital Follow-up (SOB post vaginal delivery. Patient was admitted to [facility] on 05/04/2021.)      HISTORY OF PRESENT ILLNESS:    She is a pleasant 36 y.o. female who presents today for hospital follow-up.  She recently had a vaginal delivery 05/02/2021.  A few days later after discharge she was quite short of breath.  She initially thought she may have been having a panic attack.  However when she came to the hospital she was found to be hypoxic.  She also had elevated BNP and slightly elevated troponin.  Her EKG was normal.  She did not have any chest discomfort.  She does have a family history of pulmonary and cutaneous sarcoidosis.  Of note, she did have gestational hypertension.  Anxiety and could be contributory factor.    She was treated with diuretics.  Echocardiogram 05/04/2021 showed LVEF 60%, no significant valvular abnormalities.  CT showed bilateral pleural effusions, no pulmonary embolism.    Since discharge she has seen OB/GYN and follow-up where she had elevated blood pressures and underlying anxiety.  She was continued on Lasix.  Labetalol 100 mg twice daily was initiated.  She was given clonazepam 0.5 mg to use as needed.  She was also recommended to see [facility].    She has been on Lexapro for a few days.  She has been labetalol for about 6 days.  Her blood pressures have improved but remain a bit elevated today at 140/100 on my retake.  She is euvolemic on exam.      PAST MEDICAL HISTORY: She has a past medical history of Anxiety, Disorder of thyroid, Hypertension, and Vaginal  delivery (05/02/2021). She has a past surgical history that includes Thyroid cyst excision (Right) and Wisdom tooth extraction (Bilateral, 09/2015).    ALLERGIES:   Allergies   Allergen Reactions    Penicillins Hives, Other (See Comments) and Rash     Pt unsure; reaction as child       Other Environmental      seasonal       MEDICATIONS:   Current Outpatient Medications:     acetaminophen (TYLENOL) 325 MG tablet, Take 2 tablets (650 mg) by mouth every 6 (six) hours as needed for Pain (Patient not taking: Reported on 05/09/2021), Disp: 30 tablet, Rfl: 1    clonazePAM (KlonoPIN) 0.5 MG tablet, Take 1 tablet (0.5 mg) by mouth 2 (two) times daily as needed for Anxiety, Disp: 15 tablet, Rfl: 0    escitalopram (LEXAPRO) 10 MG tablet, Take 1 tablet (10 mg) by mouth daily, Disp: 90 tablet, Rfl: 0    furosemide (LASIX) 20 MG tablet, Take 1 tablet (20 mg) by mouth daily (Patient not taking: Reported on 05/09/2021), Disp: 7 tablet, Rfl: 0    labetalol (NORMODYNE) 200 MG tablet, Take 1 tablet (200 mg) by mouth 2 (two) times daily, Disp: 60 tablet, Rfl: 1    Prenatal MV & Min w/FA-DHA (Prenatal Adult Gummy/DHA/FA) 0.4-25 MG Chew Tab, Chew 1 tablet by mouth daily (Patient not taking:  Reported on 05/09/2021), Disp: , Rfl:      FAMILY HISTORY: family history includes Bell's palsy in her mother; Diabetes in her father; Hyperlipidemia in her father and mother; Hypertension in her mother; Sarcoidosis in her father; Stroke in her mother.    SOCIAL HISTORY: She reports that she has never smoked. She has never used smokeless tobacco. She reports that she does not currently use alcohol. She reports that she does not use drugs.    PHYSICAL EXAMINATION    Visit Vitals  Ht 1.702 m (5\' 7" )   Wt 78 kg (172 lb)   LMP 07/28/2020 (Exact Date)   BMI 26.94 kg/m       General Appearance:  A well-appearing female in no acute distress.    CONSTITUTIONAL: Cooperative, alert and oriented, well developed, well nourished, in no acute distress.   SKIN: Warm  and dry to touch; no apparent lesions  HEAD: Normocephalic   EYES: Conjunctivae and lids normal   ENT: No pallor or cyanosis   NECK: JVP normal,   CHEST: Clear to auscultation bilaterally   CARDIAC: Regular rhythm;   Normal S1 and S2;  No S3 or S4;   no murmurs;  no gallops;  no rubs detected.   EXTREMITIES AND BACK: No deformities, clubbing, cyanosis, erythema or edema observed  PSYCHIATRIC: Appropriate affect; Normal memory   NEUROLOGICAL: No gross motor or sensory deficits noted, oriented to time, person and place.     ECG: NSR    LABS:   No results found for: CBC  Lab Results   Component Value Date    AST 36 05/09/2021    ALT 24 05/09/2021     No results found for: LIPID  Lab Results   Component Value Date    BNP 1,635 (H) 05/04/2021    TSH 1.41 10/19/2020         Most recent echo and nuclear study reviewed.      IMPRESSION:   [name] is a 36 y.o. female with the following problems:    Recent hospitalization with shortness of breath  Concern for volume overload and congestive heart failure  Echocardiogram 05/04/2021: Normal LVEF 60%, no significant valvular abnormalities  Recent vaginal delivery  Anxiety-now on Lexapro      RECOMMENDATIONS:    She is recently started Lexapro and has been on this for about 3 days.  Labetalol has been initiated for about 6 days.  Blood pressure is improved but still not at goal.    Going to have her continue to monitor her pressures twice daily and record.  She will send myself or Dr. Leavy Cella some readings in the next week or so.  We can make further titrations in her medications if warranted.    She has an appointment to see her OB/GYN in about 5 to 6 weeks.  Notify us for any worsening symptoms questions or concerns.  She is not interested in having any more children at this time.                                                   Orders Placed This Encounter   Procedures    ECG 12 lead (Normal)       No orders of the defined types were placed in this  encounter.      SIGNED:  Wilford Sports, NP          This note was generated by the Dragon speech recognition and may contain errors or omissions not intended by the user. Grammatical errors, random word insertions, deletions, pronoun errors, and incomplete sentences are occasional consequences of this technology due to software limitations. Not all errors are caught or corrected. If there are questions or concerns about the content of this note or information contained within the body of this dictation, they should be addressed directly with the author for clarification.

## 2021-05-17 ENCOUNTER — Ambulatory Visit
Admission: RE | Admit: 2021-05-17 | Discharge: 2021-05-17 | Disposition: A | Discharge status: Home / Self Care | Source: Ambulatory Visit | Attending: Obstetrics & Gynecology | Admitting: Obstetrics & Gynecology

## 2021-05-17 DIAGNOSIS — O165 Unspecified maternal hypertension, complicating the puerperium: Secondary | ICD-10-CM | POA: Insufficient documentation

## 2021-05-17 LAB — PROTEIN, URINE, 24 HOUR: Protein 24HR, UR: 54.9 mg/24 hr (ref 0.0–299.0)

## 2021-06-12 ENCOUNTER — Ambulatory Visit (HOSPITAL_BASED_OUTPATIENT_CLINIC_OR_DEPARTMENT_OTHER): Admitting: Student in an Organized Health Care Education/Training Program

## 2021-06-13 ENCOUNTER — Encounter (HOSPITAL_BASED_OUTPATIENT_CLINIC_OR_DEPARTMENT_OTHER): Payer: Self-pay | Admitting: Obstetrics & Gynecology

## 2021-06-13 ENCOUNTER — Ambulatory Visit (INDEPENDENT_AMBULATORY_CARE_PROVIDER_SITE_OTHER): Admitting: Obstetrics & Gynecology

## 2021-06-13 DIAGNOSIS — Z7185 Encounter for immunization safety counseling: Secondary | ICD-10-CM

## 2021-06-13 DIAGNOSIS — Z124 Encounter for screening for malignant neoplasm of cervix: Secondary | ICD-10-CM

## 2021-06-13 DIAGNOSIS — Z23 Encounter for immunization: Secondary | ICD-10-CM

## 2021-06-13 DIAGNOSIS — Z3009 Encounter for other general counseling and advice on contraception: Secondary | ICD-10-CM

## 2021-06-13 DIAGNOSIS — Z1151 Encounter for screening for human papillomavirus (HPV): Secondary | ICD-10-CM

## 2021-06-13 LAB — THIN PREP-PAP W/IMAGING & HPV DNA (HIGH RISK W/16/18)

## 2021-06-13 MED ORDER — HPV 9-VALENT RECOMB VACCINE IM SUSY
0.5000 mL | PREFILLED_SYRINGE | Freq: Once | INTRAMUSCULAR | Status: AC
Start: 2021-06-13 — End: 2021-06-13
  Administered 2021-06-13: 0.5 mL via INTRAMUSCULAR

## 2021-06-13 MED ORDER — NORETHINDRONE 0.35 MG PO TABS
1.0000 | ORAL_TABLET | Freq: Every day | ORAL | 3 refills | Status: DC
Start: 2021-06-13 — End: 2021-06-21

## 2021-06-13 MED ORDER — LABETALOL HCL 200 MG PO TABS
200.0000 mg | ORAL_TABLET | Freq: Two times a day (BID) | ORAL | 3 refills | Status: DC
Start: 2021-06-13 — End: 2021-07-18

## 2021-06-13 NOTE — Progress Notes (Signed)
Post-partum Visit    HPI:  Patient is here for a post-partum visit. She is 6 weeks post-partum following an SVD on 05/02/2021. The delivery was at 39.5 weeks. Delivery was: complicated by prolonged 2nd stage, with low APGARs requiring resuscitation (extubated same-day in few hours) and brief NICU stent for baby .  Pregnancy was: c/b GHTN, AMA, anxiety, alpha thal.  She is accompanied today by husband and baby; both doing well.     Postpartum course has been: complicated by severe GHTN and anxiety; currently on meds . She was readmitted for fluid overload and pulmonary edema (w/o PP cardiomyopathy); given Lasix and improved significantly.  She was seen by Cardiologist and was referred back to GYN and PCP.     Echocardiogram 05/04/2021 showed LVEF 60%, no significant valvular abnormalities.  CT showed bilateral pleural effusions, no pulmonary embolism.    Baby's course has been unremarkable.   Baby is feeding by: no longer breastfeeding, currently bottle feeding  Bleeding: thin lochia. Bowel function is normal. Bladder function is normal.    Patient has not resumed sexual activity.   Postpartum depression screening: negative. EPDS score = 8 / Support system:  good.  Planned contraception method: desires NuvaRing     Gyn Hx:  Last PAP: 2021 normal per pt  Hx abnormal pap: Yes, 2018 & 2020 - colposcopies benign per pt   Menses: regular  BCM previous: Nuvaring  Sexual Hx: active  Hx STDs: Yes Chlamydia remote   HPV vaccine: not completed   OB Hx: G2P0010 (EAB x1, no D&C)    # 1 - Date: None, Sex: None, Weight: None, GA: None, Delivery: None, Apgar1: None, Apgar5: None, Living: None, Birth Comments: None    # 2 - Date: 05/02/21, Sex: Female, Weight: 7 lb 9 oz (3.43 kg), GA: [redacted]w[redacted]d, Delivery: Vaginal, Spontaneous, Apgar1: 1, Apgar5: 3, Living: Living, Birth Comments: None    Review of Systems:  General ROS: negative for fatigue, fever/chills, weight loss  Breast ROS: negative for breast lumps, no issues breast  feeding  Gastrointestinal ROS: no abdominal pain, denies change in bowel habits  Genitourinary ROS: no dysuria, trouble voiding, hematuria, abnormal bleeding or discharge, denies changes in urinary habits  Skin: denies rash or lesion  Psych: denies depression  All other systems reviewed - negative     Vitals:    06/13/21 0938   BP: (!) 146/94   Pulse: 83   Temp: 97.4 F (36.3 C)   Weight: 181 lb 6.4 oz (82.3 kg)     General appearance - alert, well appearing, and in no distress  Breasts: breasts appear normal, symmetrical, no suspicious masses, no skin or nipple changes or axillary nodes, no nipple discharge, lactating no evidence of mastitis  Abdomen - soft, non-tender, nondistended, no masses  Extremities: no signs of clubbing or edema     Pelvic exam:  VULVA: normal appearing vulva with no masses, tenderness or lesions, normal clitoris  VAGINA: normal appearing vagina with normal color and discharge, no lesions, well-healed perineum  CERVIX: normal appearing cervix without discharge or lesions,  UTERUS: uterus is normal size, shape, consistency and nontender, anteverted  ADNEXA:  nontender and no masses.  RECTAL: normal external, no external hemorrhoids, internal exam not performed    Assessment:   Routine 6wk PP check  1. Encounter for postpartum care after hospital delivery        2. Encounter for other general counseling or advice on contraception        3.  HPV vaccine counseling  human papillomavirus 9-valent vaccine (GARDASIL 9) injection 0.5 mL      4. Need for HPV vaccine  human papillomavirus 9-valent vaccine (GARDASIL 9) injection 0.5 mL      5. Encounter for screening for cervical cancer  Thin Prep PAP w/Imaging & HPV DNA(high risk w/16/18      6. Screening for human papillomavirus (HPV)  Thin Prep PAP w/Imaging & HPV DNA(high risk w/16/18        Plan:  - Continue routine post-partum care, may resume sexual activity  - Breast feeding encouraged if currently lactating; support offered   - Contraception  options reviewed: wanted NuvaRing but due to HTN recommend Progestin only methord or LARC, patient opts to use mini-pill (given strict instructions for use re: timing, need to switch methods once weaning). Micronor E-scribed with RFs x1 year; questions addressed.   - Continue Lexapro for anxiety  - Recommend obtaining PCP to monitor BP's and titrate meds; continue Labetalol 200 mg BID for now.   - Recommend HPV vaccine: 1st dose today, f/u 2 months for 2nd dose, 6 months for 3rd dose.   - PAP/HPV collected  - Follow-up: annual GYN exam, 2 month nurse visit and prn    Shireen Quan. Mayford Knife, MD, [facility]  [facility] [place]

## 2021-06-13 NOTE — Patient Instructions (Signed)
[facility] PCP's  Dr. Shelia Media, Dr. Norberto Sorenson [facility])  Dr. Ned Grace, Dr. Neoma Laming [facility])  Dr. ?? ([facility])

## 2021-06-13 NOTE — Progress Notes (Signed)
Patient's allergies reviewed.  After obtaining consent and orders per Dr. Williams, HPV vaccine dose 1 administered.  Patient instructed to remain in clinic for 15 minutes afterwards and report any adverse reactions immediately.  Patient left office in good condition.

## 2021-06-14 ENCOUNTER — Encounter (HOSPITAL_BASED_OUTPATIENT_CLINIC_OR_DEPARTMENT_OTHER): Payer: Self-pay | Admitting: Obstetrics & Gynecology

## 2021-06-14 LAB — THIN PREP-PAP W/IMAGING & HPV DNA (HIGH RISK W/16/18)
HPV Genotype, 16: NEGATIVE
HPV Genotype, 18: NEGATIVE
HPV High Risk, Other: NEGATIVE

## 2021-06-16 LAB — LAB USE ONLY - HISTORICAL GYN CYTOLOGY/PAP SMEAR

## 2021-06-21 ENCOUNTER — Other Ambulatory Visit (HOSPITAL_BASED_OUTPATIENT_CLINIC_OR_DEPARTMENT_OTHER): Payer: Self-pay | Admitting: Obstetrics & Gynecology

## 2021-06-23 MED ORDER — NORETHINDRONE 0.35 MG PO TABS
1.0000 | ORAL_TABLET | Freq: Every day | ORAL | 3 refills | Status: AC
Start: 2021-06-23 — End: 2022-06-23

## 2021-06-23 NOTE — Telephone Encounter (Signed)
Pt requests prescription be resubmitted as [facility] has a new system and they are requesting this to be resubmitted

## 2021-06-23 NOTE — Telephone Encounter (Signed)
Done, please advise, thanks  SJW

## 2021-07-07 ENCOUNTER — Encounter (HOSPITAL_BASED_OUTPATIENT_CLINIC_OR_DEPARTMENT_OTHER): Payer: Self-pay | Admitting: Obstetrics & Gynecology

## 2021-07-18 ENCOUNTER — Other Ambulatory Visit (HOSPITAL_BASED_OUTPATIENT_CLINIC_OR_DEPARTMENT_OTHER): Payer: Self-pay | Admitting: Obstetrics & Gynecology

## 2021-07-22 MED ORDER — LABETALOL HCL 200 MG PO TABS
200.0000 mg | ORAL_TABLET | Freq: Two times a day (BID) | ORAL | 3 refills | Status: AC
Start: 2021-07-22 — End: ?

## 2021-07-31 ENCOUNTER — Encounter (HOSPITAL_BASED_OUTPATIENT_CLINIC_OR_DEPARTMENT_OTHER): Payer: Self-pay | Admitting: Obstetrics & Gynecology
